# Patient Record
Sex: Male | Born: 1985 | Race: White | Hispanic: No | Marital: Married | Smoking: Never smoker
Health system: Southern US, Community
[De-identification: ages and names within clinical notes are randomized; demographics above are authoritative.]

## PROBLEM LIST (undated history)

## (undated) DIAGNOSIS — K5090 Crohn's disease, unspecified, without complications: Secondary | ICD-10-CM

## (undated) DIAGNOSIS — K219 Gastro-esophageal reflux disease without esophagitis: Secondary | ICD-10-CM

## (undated) DIAGNOSIS — K5000 Crohn's disease of small intestine without complications: Secondary | ICD-10-CM

## (undated) DIAGNOSIS — K589 Irritable bowel syndrome without diarrhea: Secondary | ICD-10-CM

## (undated) HISTORY — DX: Gastro-esophageal reflux disease without esophagitis: K21.9

## (undated) HISTORY — DX: Irritable bowel syndrome, unspecified: K58.9

## (undated) HISTORY — DX: Crohn's disease of small intestine without complications: K50.00

---
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
## 2004-04-25 ENCOUNTER — Ambulatory Visit: Payer: Self-pay | Admitting: Family Medicine

## 2005-01-27 ENCOUNTER — Ambulatory Visit: Payer: Self-pay | Admitting: Family Medicine

## 2005-04-10 ENCOUNTER — Ambulatory Visit: Payer: Self-pay | Admitting: Internal Medicine

## 2005-07-25 ENCOUNTER — Ambulatory Visit: Payer: Self-pay | Admitting: Family Medicine

## 2006-01-12 ENCOUNTER — Ambulatory Visit: Payer: Self-pay | Admitting: Internal Medicine

## 2006-11-06 ENCOUNTER — Ambulatory Visit: Payer: Self-pay | Admitting: Family Medicine

## 2007-03-22 ENCOUNTER — Telehealth: Payer: Self-pay | Admitting: Family Medicine

## 2007-03-27 ENCOUNTER — Ambulatory Visit: Payer: Self-pay | Admitting: Family Medicine

## 2007-03-27 LAB — CONVERTED CEMR LAB
Bilirubin Urine: NEGATIVE
Ketones, urine, test strip: NEGATIVE
Nitrite: NEGATIVE
Specific Gravity, Urine: 1.02
WBC Urine, dipstick: NEGATIVE
pH: 7

## 2007-04-01 ENCOUNTER — Encounter: Payer: Self-pay | Admitting: Family Medicine

## 2007-04-01 LAB — CONVERTED CEMR LAB
ALT: 27 units/L (ref 0–53)
AST: 23 units/L (ref 0–37)
Albumin: 4.6 g/dL (ref 3.5–5.2)
BUN: 13 mg/dL (ref 6–23)
Bilirubin, Direct: 0.2 mg/dL (ref 0.0–0.3)
CO2: 32 meq/L (ref 19–32)
Chloride: 103 meq/L (ref 96–112)
Cholesterol: 157 mg/dL (ref 0–200)
Creatinine, Ser: 0.9 mg/dL (ref 0.4–1.5)
GFR calc non Af Amer: 112 mL/min
HCT: 43.2 % (ref 39.0–52.0)
HDL: 32.6 mg/dL — ABNORMAL LOW (ref >39.0)
Hemoglobin: 15.5 g/dL (ref 13.0–17.0)
LDL Cholesterol: 112 mg/dL — ABNORMAL HIGH (ref 0–99)
MCV: 85.9 fL (ref 78.0–100.0)
Monocytes Relative: 7.9 % (ref 3.0–11.0)
Neutro Abs: 3.9 10*3/uL (ref 1.4–7.7)
Platelets: 247 10*3/uL (ref 150–400)
Potassium: 4.7 meq/L (ref 3.5–5.1)
RDW: 12 % (ref 11.5–14.6)
Sodium: 140 meq/L (ref 135–145)
TSH: 1.4 microintl units/mL (ref 0.35–5.50)
Total Protein: 6.7 g/dL (ref 6.0–8.3)

## 2007-06-14 ENCOUNTER — Ambulatory Visit: Payer: Self-pay | Admitting: Family Medicine

## 2007-06-14 DIAGNOSIS — J209 Acute bronchitis, unspecified: Secondary | ICD-10-CM | POA: Insufficient documentation

## 2007-08-04 DIAGNOSIS — J029 Acute pharyngitis, unspecified: Secondary | ICD-10-CM | POA: Insufficient documentation

## 2007-08-09 ENCOUNTER — Ambulatory Visit: Payer: Self-pay | Admitting: Family Medicine

## 2007-08-09 LAB — CONVERTED CEMR LAB: Rapid Strep: NEGATIVE

## 2007-09-25 DIAGNOSIS — K589 Irritable bowel syndrome without diarrhea: Secondary | ICD-10-CM | POA: Insufficient documentation

## 2007-10-02 ENCOUNTER — Encounter (INDEPENDENT_AMBULATORY_CARE_PROVIDER_SITE_OTHER): Payer: Self-pay | Admitting: *Deleted

## 2008-02-18 ENCOUNTER — Encounter: Payer: Self-pay | Admitting: Internal Medicine

## 2008-06-22 ENCOUNTER — Ambulatory Visit: Payer: Self-pay | Admitting: Family Medicine

## 2008-06-22 DIAGNOSIS — R21 Rash and other nonspecific skin eruption: Secondary | ICD-10-CM | POA: Insufficient documentation

## 2008-07-10 ENCOUNTER — Ambulatory Visit: Payer: Self-pay | Admitting: Family Medicine

## 2008-07-10 DIAGNOSIS — J069 Acute upper respiratory infection, unspecified: Secondary | ICD-10-CM | POA: Insufficient documentation

## 2009-08-10 ENCOUNTER — Telehealth: Payer: Self-pay | Admitting: Family Medicine

## 2009-12-10 DIAGNOSIS — K5000 Crohn's disease of small intestine without complications: Secondary | ICD-10-CM

## 2009-12-10 HISTORY — DX: Crohn's disease of small intestine without complications: K50.00

## 2009-12-10 HISTORY — PX: COLONOSCOPY: SHX174

## 2009-12-15 ENCOUNTER — Ambulatory Visit: Payer: Self-pay | Admitting: Gastroenterology

## 2009-12-15 ENCOUNTER — Encounter (INDEPENDENT_AMBULATORY_CARE_PROVIDER_SITE_OTHER): Payer: Self-pay | Admitting: *Deleted

## 2009-12-16 ENCOUNTER — Encounter: Payer: Self-pay | Admitting: Gastroenterology

## 2009-12-16 LAB — CONVERTED CEMR LAB
BUN: 15 mg/dL (ref 6–23)
Chloride: 105 meq/L (ref 96–112)
Creatinine, Ser: 0.8 mg/dL (ref 0.4–1.5)
Eosinophils Relative: 2.3 % (ref 0.0–5.0)
Glucose, Bld: 112 mg/dL — ABNORMAL HIGH (ref 70–99)
HCT: 45.2 % (ref 39.0–52.0)
Hemoglobin: 15.9 g/dL (ref 13.0–17.0)
IgA: 124 mg/dL (ref 68–378)
Lymphocytes Relative: 28.4 % (ref 12.0–46.0)
Monocytes Relative: 7.3 % (ref 3.0–12.0)
Platelets: 264 10*3/uL (ref 150.0–400.0)
Potassium: 4.1 meq/L (ref 3.5–5.1)
RBC: 5.11 M/uL (ref 4.22–5.81)
RDW: 13.1 % (ref 11.5–14.6)
Sed Rate: 3 mm/hr (ref 0–22)
TSH: 0.87 microintl units/mL (ref 0.35–5.50)

## 2009-12-17 LAB — CONVERTED CEMR LAB: Tissue Transglutaminase Ab, IgA: 2.1 units (ref <20)

## 2010-01-05 ENCOUNTER — Ambulatory Visit: Payer: Self-pay | Admitting: Gastroenterology

## 2010-01-06 ENCOUNTER — Encounter (INDEPENDENT_AMBULATORY_CARE_PROVIDER_SITE_OTHER): Payer: Self-pay | Admitting: *Deleted

## 2010-01-11 DIAGNOSIS — K5090 Crohn's disease, unspecified, without complications: Secondary | ICD-10-CM | POA: Insufficient documentation

## 2010-01-18 ENCOUNTER — Ambulatory Visit (HOSPITAL_COMMUNITY): Admission: RE | Admit: 2010-01-18 | Discharge: 2010-01-18 | Payer: Self-pay | Admitting: Gastroenterology

## 2010-01-26 ENCOUNTER — Telehealth: Payer: Self-pay | Admitting: Gastroenterology

## 2010-02-09 ENCOUNTER — Telehealth: Payer: Self-pay | Admitting: Gastroenterology

## 2010-02-11 ENCOUNTER — Ambulatory Visit: Payer: Self-pay | Admitting: Gastroenterology

## 2010-02-11 DIAGNOSIS — R1031 Right lower quadrant pain: Secondary | ICD-10-CM | POA: Insufficient documentation

## 2010-02-15 ENCOUNTER — Ambulatory Visit: Payer: Self-pay | Admitting: Cardiovascular Disease

## 2010-02-28 ENCOUNTER — Ambulatory Visit: Payer: Self-pay | Admitting: Gastroenterology

## 2010-03-10 ENCOUNTER — Telehealth: Payer: Self-pay | Admitting: Gastroenterology

## 2010-03-29 ENCOUNTER — Telehealth: Payer: Self-pay | Admitting: Gastroenterology

## 2010-04-11 ENCOUNTER — Ambulatory Visit: Payer: Self-pay | Admitting: Gastroenterology

## 2010-04-12 ENCOUNTER — Ambulatory Visit: Payer: Self-pay | Admitting: Gastroenterology

## 2010-04-12 LAB — CONVERTED CEMR LAB
ALT: 28 units/L (ref 0–53)
Albumin: 4.4 g/dL (ref 3.5–5.2)
Basophils Absolute: 0 10*3/uL (ref 0.0–0.1)
Basophils Relative: 0.2 % (ref 0.0–3.0)
CO2: 34 meq/L — ABNORMAL HIGH (ref 19–32)
Creatinine, Ser: 0.8 mg/dL (ref 0.4–1.5)
Glucose, Bld: 64 mg/dL — ABNORMAL LOW (ref 70–99)
HCT: 44.3 % (ref 39.0–52.0)
Hemoglobin: 15.5 g/dL (ref 13.0–17.0)
Lymphs Abs: 2.6 10*3/uL (ref 0.7–4.0)
Monocytes Absolute: 1.1 10*3/uL — ABNORMAL HIGH (ref 0.1–1.0)
Neutro Abs: 10.9 10*3/uL — ABNORMAL HIGH (ref 1.4–7.7)
Neutrophils Relative %: 74.6 % (ref 43.0–77.0)
Total Bilirubin: 1.1 mg/dL (ref 0.3–1.2)
Total Protein: 6.9 g/dL (ref 6.0–8.3)
WBC: 14.6 10*3/uL — ABNORMAL HIGH (ref 4.5–10.5)

## 2010-04-13 ENCOUNTER — Telehealth: Payer: Self-pay | Admitting: Gastroenterology

## 2010-04-13 ENCOUNTER — Telehealth (INDEPENDENT_AMBULATORY_CARE_PROVIDER_SITE_OTHER): Payer: Self-pay | Admitting: *Deleted

## 2010-04-18 ENCOUNTER — Telehealth: Payer: Self-pay | Admitting: Gastroenterology

## 2010-04-26 ENCOUNTER — Ambulatory Visit: Payer: Self-pay | Admitting: Gastroenterology

## 2010-04-29 ENCOUNTER — Telehealth (INDEPENDENT_AMBULATORY_CARE_PROVIDER_SITE_OTHER): Payer: Self-pay | Admitting: *Deleted

## 2010-05-04 ENCOUNTER — Telehealth: Payer: Self-pay | Admitting: Internal Medicine

## 2010-05-25 ENCOUNTER — Telehealth: Payer: Self-pay | Admitting: Gastroenterology

## 2010-05-25 ENCOUNTER — Telehealth: Payer: Self-pay | Admitting: Family Medicine

## 2010-05-25 ENCOUNTER — Emergency Department (HOSPITAL_COMMUNITY)
Admission: EM | Admit: 2010-05-25 | Discharge: 2010-05-26 | Payer: Self-pay | Source: Home / Self Care | Admitting: Emergency Medicine

## 2010-05-26 ENCOUNTER — Telehealth: Payer: Self-pay | Admitting: Gastroenterology

## 2010-05-26 ENCOUNTER — Ambulatory Visit: Payer: Self-pay | Admitting: Internal Medicine

## 2010-05-26 ENCOUNTER — Encounter (INDEPENDENT_AMBULATORY_CARE_PROVIDER_SITE_OTHER): Payer: Self-pay | Admitting: *Deleted

## 2010-05-26 DIAGNOSIS — K5000 Crohn's disease of small intestine without complications: Secondary | ICD-10-CM | POA: Insufficient documentation

## 2010-05-26 DIAGNOSIS — R112 Nausea with vomiting, unspecified: Secondary | ICD-10-CM | POA: Insufficient documentation

## 2010-05-26 DIAGNOSIS — R197 Diarrhea, unspecified: Secondary | ICD-10-CM | POA: Insufficient documentation

## 2010-05-31 ENCOUNTER — Ambulatory Visit: Payer: Self-pay | Admitting: Gastroenterology

## 2010-06-16 ENCOUNTER — Telehealth (INDEPENDENT_AMBULATORY_CARE_PROVIDER_SITE_OTHER): Payer: Self-pay | Admitting: *Deleted

## 2010-06-28 ENCOUNTER — Encounter: Payer: Self-pay | Admitting: Gastroenterology

## 2010-07-10 LAB — CONVERTED CEMR LAB
Basophils Relative: 0.3 % (ref 0.0–3.0)
Eosinophils Absolute: 0.2 10*3/uL (ref 0.0–0.7)
GFR calc non Af Amer: 122.62 mL/min (ref >60)
Glucose, Bld: 75 mg/dL (ref 70–99)
HCT: 45.5 % (ref 39.0–52.0)
Hemoglobin: 15.7 g/dL (ref 13.0–17.0)
Lymphs Abs: 2.4 10*3/uL (ref 0.7–4.0)
Monocytes Absolute: 0.9 10*3/uL (ref 0.1–1.0)
Monocytes Relative: 6.8 % (ref 3.0–12.0)
Neutro Abs: 9.7 10*3/uL — ABNORMAL HIGH (ref 1.4–7.7)
Neutrophils Relative %: 73.6 % (ref 43.0–77.0)
RBC: 5.07 M/uL (ref 4.22–5.81)
RDW: 12.9 % (ref 11.5–14.6)
Sed Rate: 4 mm/hr (ref 0–22)
Sodium: 136 meq/L (ref 135–145)
WBC: 13.1 10*3/uL — ABNORMAL HIGH (ref 4.5–10.5)

## 2010-07-14 NOTE — Assessment & Plan Note (Signed)
Review of gastrointestinal problems: 1. Crohn's disease.  Presented with diarrhea, urgency, right lower quadrant discomfort.  Sedimentation rate normal.  Colonoscopy July 2011 found normal colon, terminal ileum was ulcerated, inflamed. Biopsies showed active, chronic ileitis. Small bowel follow-through July 2011 confirmed inflammation in terminal ileum, otherwise was normal.  August, 2011 CT scan showed no clear bowel disease however there was some right lower quadrant Subcentimeter lymph nodes . Prometheus testing July 2011 was "not consistent with IBD," TPMT phenotype was normal.  August, 2011 Entocort caused worse cramping, nausea. September, 2011 prednisone 40 mg a day greatly helped his symptoms however he could not taper past 25-30 mg a day.    History of Present Illness Visit Type: Follow-up Visit Primary GI MD: Owens Loffler MD Primary Provider: Owens Loffler, MD  Requesting Provider: na Chief Complaint: F/u for Crohn's History of Present Illness:     very pleasant 25 year old man whom I last saw about 5-6 weeks ago. He was on 20 two times a day prednisone and has he tapered  from 30 down to 25 he noticed the diarrhea significantly returned and the RLQ nagging pain worsened.    HE has been edgy and tempermental on the prednisone and wants to get off if possible.  He has noticed that his stools smell much worse.    will have 4-5 loose stools a day, some nocturnal.  Urgency has improved.           Current Medications (verified): 1)  Prednisone 10 Mg  Tabs (Prednisone) .... Take 1.5 Pills, Twice A Day 2)  Protonix 40 Mg  Tbec (Pantoprazole Sodium) .[facility] Kitchen.. 1 Each Day 30 Minutes Before Meal 3)  Azathioprine 50 Mg  Tabs (Azathioprine) .... Take 4 Pills, Once A Day  Allergies (verified): 1)  ! Amoxicillin 2)  ! * Omneceff  Vital Signs:  Patient profile:   25 year old male Height:      72 inches Weight:      192 pounds BMI:     26.13 BSA:     2.10 Pulse rate:   74 /  minute Pulse rhythm:   regular BP sitting:   126 / 80  (left arm) Cuff size:   regular  Vitals Entered By: Hope Pigeon CMA (April 12, 2010 10:56 AM)  Physical Exam  Additional Exam:  Constitutional: generally well appearing Psychiatric: alert and oriented times 3 Abdomen: soft, non-tender, non-distended, normal bowel sounds    Impression & Recommendations:  Problem # 1:  Crohn's ileitis he is fairly steroid responsive however he is unable to taper past 25-30 mg of prednisone a day. We will start him on azathioprine 200 mg a day. He'll get a repeat set of labs today including a CBC, complete metabolic profile and we will repeat this in 2 weeks as well. I discussed some of the potential risks of azathioprine including pancreatitis, lymphoma, immunosuppression related infections. He understands and wishes to proceed. I also directed him to the [facility]'s [facility] [employer] [facility] [place] website.  Other Orders: TLB-CBC Platelet - w/Differential (85025-CBCD) TLB-CMP (Comprehensive Metabolic Pnl) (97989-QJJH)  Patient Instructions: 1)  Start azathiaprine 139m pills, take 2 pills once a day. 2)  You will get lab test(s) done today (cbc, cmet) and then in 2 weeks. 3)  Try tapering off prednisone again in 1 month.  For now, hold your prednisone at current dose. 4)  Return to see Dr. JArdis Hughsin 6-7 weeks. 5)  The medication list was reviewed and reconciled.  All  changed / newly prescribed medications were explained.  A complete medication list was provided to the patient / caregiver. Prescriptions: AZATHIOPRINE 50 MG  TABS (AZATHIOPRINE) take 4 pills, once a day  #120 x 3   Entered and Authorized by:   Milus Banister MD   Signed by:   Milus Banister MD on 04/12/2010   Method used:   Electronically to        [facility]       [address]       [place], [place]  [address]       Ph: [phone]       Fax: [phone]   RxID:    [id number]

## 2010-07-14 NOTE — Assessment & Plan Note (Signed)
Review of gastrointestinal problems: 1. Crohn's disease.  Presented with diarrhea, urgency, right lower quadrant discomfort.  Sedimentation rate normal.  Colonoscopy July 2011 found normal colon, terminal ileum was ulcerated, inflamed. Biopsies showed active, chronic ileitis. Small bowel follow-through July 2011 confirmed inflammation in terminal ileum, otherwise was normal.      History of Present Illness Visit Type: Follow-up Visit Primary GI MD: Owens Loffler MD Primary Provider: Owens Loffler, MD  Requesting Provider: na Chief Complaint: Generalized abd pain and diarrhea  History of Present Illness:     very pleasant 25 year old man who is here with his wife today.  ever since he started the entocort, the pain on RLQ has become much more prominant, nagging, getting worse.  HE had doubling over, sharp pain that lasted all night.  Nausea but no vomiting.  he has had pyrosis, which is really getting bothersome, for past 4-5 nights.  His throat is swollwn with pain, hurting when he swallows.  Was put on biaxin 2-3 weeks ago for a sinus infection.  He is still having loose stools, maybe a bit better...the urgency is gone!           Current Medications (verified): 1)  Entocort Ec 3 Mg  Cp24 (Budesonide) .... Take 3 Capsules Daily 2)  Advil 200 Mg Tabs (Ibuprofen) .... As Needed  Allergies (verified): 1)  ! Amoxicillin 2)  ! * Omneceff  Vital Signs:  Patient profile:   25 year old male Height:      72 inches Weight:      185 pounds BMI:     25.18 BSA:     2.06 Pulse rate:   62 / minute Pulse rhythm:   regular BP sitting:   120 / 72  (left arm) Cuff size:   regular  Vitals Entered By: Hope Pigeon CMA (February 11, 2010 3:13 PM)  Physical Exam  Additional Exam:  Constitutional: generally well appearing Psychiatric: alert and oriented times 3 Abdomen: soft, Mildly tender in the right lower quadrant, non-distended, normal bowel sounds    Impression &  Recommendations:  Problem # 1:  Crohn's ileitis I am quite confident of the diagnosis of Crohn's disease in his terminal ileum. See the results summarized above. He is not really feeling much better on Entocort 3 pills a day. Perhaps his disease is worsening, perhaps the Entocort simply is not enough to control his disease. I think he does have some heartburn for which she will take proton pump inhibitor once daily. I'm going to change her to prednisone from Entocort. 20 mg twice daily. I recommended Benadryl he has sleep disturbances. I'm going to get another set of basic labs including a CBC, complete metabolic profile, some Prometheus testing. Since he is still having fairly significant right lower quadrant discomfort wanting a CT scan to make sure we are not missing an abscesshowever however I doubt that will be the case. He'll return to see me in 2 weeks and sooner if needed.  Other Orders: TLB-CBC Platelet - w/Differential (85025-CBCD) TLB-BMP (Basic Metabolic Panel-BMET) (67893-YBOFBPZ) TLB-Sedimentation Rate (ESR) (85652-ESR) IBD Serology (Prometheus #1007) (02585) TPMT Enzyme (Prometheus #3320) (27782) CT Abdomen/Pelvis with Contrast (CT Abd/Pelvis w/con)  Patient Instructions: 1)  Avoid NSAIDs, use tylenol for routine pains. 2)  You will be scheduled for a CT scan of abdomen and pelvis with IV and oral contrast. 3)  Samples of PPI (nexium, protonix). Take one a day, 20-30 min before breakfast meal. 4)  You will get lab test(s) done  today (cbc, bmet, esr, prometheus first stop, TPMT phenotyping). 5)  Low fiber diet for now. 6)  Stop entocort. 7)  Start prednisone 42m pills, take 2 pills twice a day. 8)  Return to see Dr. JArdis Hughsin 2 weeks. 9)  Try benedryl 275mpills, one pill at bedtime. 10)  The medication list was reviewed and reconciled.  All changed / newly prescribed medications were explained.  A complete medication list was provided to the patient /  caregiver. Prescriptions: PREDNISONE 10 MG  TABS (PREDNISONE) take 2 pills, twice a day  #120 x 1   Entered and Authorized by:   DaMilus BanisterD   Signed by:   PaChristian MateMA (AATenahaon 02/11/2010   Method used:   [facility]ically [facility]        [facility]       [address]  [id number]     Ph: [phone]     Fax: [phone] RxID:   [id number] Appended Document:  Prometheus testing shows "pattern not consistent with inflammatory bowel disease" TMPT phenotype activity was normal

## 2010-07-14 NOTE — Assessment & Plan Note (Signed)
History of Present Illness Visit Type: new patient  Primary GI MD: Owens Loffler MD Primary Provider: Owens Loffler, MD  Requesting Provider: na Chief Complaint: Frequent BMs History of Present Illness:     Russell [name] who was self referred,  since a young boy was told he had IBS. In the past year he has had episodes of uncontrollable diarrhrea for 1-2 weeks, +can have nocturnal symptoms.  Can have small amount of red blood in TP.  Does not usually have assciated nausea/vomiting.   He has wondered if stress is playing a role.  More stress in life.  He lately has been having lower abdominal cramps, that are relieved when he moves his bowels.    Real urgency is biggest issue.    He used to think that greasy foods were the biggest cause.     No real relation with dairy.  Went to [facility] as a young boy, lactose testing was negative from what he remembers.  takes pepto at times.             Current Medications (verified): 1)  Pepto-Bismol 524 Mg/67m Susp (Bismuth Subsalicylate) .... As Needed  Allergies (verified): 1)  ! Amoxicillin 2)  ! * Omneceff 3)  ! Biaxin (Clarithromycin)  Past History:  Past Medical History: Irritable bowel-like symptoms since a young boy  Past Surgical History: Tonsillectomy, 171'sDehydrated X 2 ( GI viruses)   Family History: Parents healthy Siblings: One sister No CAD or DM Some HTN No cancer Crohn's disease: cousin   Social History: Married -839-monthld son Occupation: [employer] Sm[employer] Alcohol use-no  Review of Systems       Pertinent positive and negative review of systems were noted in the above HPI and GI specific review of systems.  All other review of systems was otherwise negative.   Vital Signs:  Patient profile:   [age]ear old male Height:      72 inches Weight:      186 pounds BMI:     25.32 BSA:     2.07 Pulse rate:   64 / minute Pulse rhythm:   regular BP  sitting:   118 / 76  (left arm) Cuff size:   regular  Vitals Entered By: KeHope PigeonMA (December 15, 2009 9:58 AM)  Physical Exam  Additional Exam:  Constitutional: generally well appearing Psychiatric: alert and oriented times 3 Eyes: extraocular movements intact Mouth: oropharynx moist, no lesions Neck: supple, no lymphadenopathy Cardiovascular: heart regular rate and rythm Lungs: CTA bilaterally Abdomen: soft, Russell mildly tender in the right lower quadrantr, non-distended, no obvious ascites, no peritoneal signs, normal bowel sounds Extremities: no lower extremity edema bilaterally Skin: no lesions on visible extremities    Impression & Recommendations:  Problem # 1:  chronic loose stools, intermittent abdominal cramps, right lower quadrant mild tenderness I think he at least has an ear double bowel component to his symptoms and prevent, the cramps that he has, I will put him on twice daily antispasmodic medicine trial. He has had symptoms for a Russell long time, nocturnal diarrhea with extreme urgency at some point. Crohn's runs in his family and certainly Crohn's and colitis could cause symptoms such as his and so I think we should proceed with full colonoscopy with look in the terminal ileum at his soonest convenience. He will also get celiac sprue testing done serologically along with a CBC, complete metabolic profile, sedimentation rate and thyroid testing.  Other  Orders: TLB-CBC Platelet - w/Differential (85025-CBCD) TLB-CMP (Comprehensive Metabolic Pnl) (71062-IRSW) TLB-TSH (Thyroid Stimulating Hormone) (84443-TSH) TLB-Sedimentation Rate (ESR) (85652-ESR) T-Sprue Panel (Celiac Disease Aby Eval) (83516x3/[address]) TLB-IgA (Immunoglobulin A) (82784-IGA)  Patient Instructions: 1)  You will get lab test(s) done today (cbc, cmet, esr, tsh, celiac sprue panel, total IgA level). 2)  You will be scheduled to have a colonoscopy. 3)  Trial of levbid, one pill twice a day. These were  called in to your pharmacy. 4)  A copy of this information will be sent to Dr. Frederico Hamman Copland. 5)  The medication list was reviewed and reconciled.  All changed / newly prescribed medications were explained.  A complete medication list was provided to the patient / caregiver. Prescriptions: LEVBID 0.375 MG  TB12 (HYOSCYAMINE SULFATE) take one pill twice daily  #60 x 2   Entered and Authorized by:   Milus Banister MD   Signed by:   Milus Banister MD on 12/15/2009   Method used:   [facility]        [facility] [place] [facility]retail[facility]       [address]       [place], [place]  [address]       Ph: [phone] or [phone]       Fax: [phone]   RxID:   [id number]   Appended Document: Orders Update/Movi    Clinical Lists Changes  Medications: Added new medication of MOVIPREP 100 GM  SOLR (PEG-KCL-NACL-NASULF-NA ASC-C) As per prep instructions. - Signed Rx of MOVIPREP 100 GM  SOLR (PEG-KCL-NACL-NASULF-NA ASC-C) As per prep instructions.;  #1 x 0;  Signed;  Entered by: Christian Mate CMA (AAMA);  Authorized by: Milus Banister MD;  Method used: [facility], [place], [place]  [address], Ph: [phone] or [phone], Fax: [phone] Orders: Added new Test order of Colonoscopy (Colon) - Signed    Prescriptions: MOVIPREP 100 GM  SOLR (PEG-KCL-NACL-NASULF-NA ASC-C) As per prep instructions.  #1 x 0   Entered by:   Christian Mate CMA ([place])   Authorized by:   Milus Banister MD   Signed by:   Christian Mate CMA ([place]) on 12/15/2009   Method used:   [facility]        [facility] [address] [facility]       [address]       [place], [place]  [address]       Ph: [phone] or [phone]       Fax: [phone]   RxID:   [id number]

## 2010-07-14 NOTE — Progress Notes (Signed)
Summary: [facility] Referal  Phone Note Other Incoming   Caller: Pt spouse Summary of Call: Pt spouse called and she states that the pt would like to be referred to Dr Mamie Laurel at [place].  I will forward the records. Initial call taken by: Christian Mate CMA ([place]),  April 13, 2010 11:14 AM

## 2010-07-14 NOTE — Progress Notes (Signed)
Summary: pt going to ER  Phone Note Call from Patient   Caller: Spouse Call For: Owens Loffler MD Summary of Call: Pt's wife states pt has been vomiting all day, has had diarrhea all day and has not urinated since 7 AM.  Advised pt's wifed that pt should go to ER for possible fluids. Initial call taken by: Marty Heck CMA, AAMA,  May 25, 2010 4:45 PM  Follow-up for Phone Call        That is probably reasonable.  It would have also been reasonable to see him in the AM since he is 25 years old. Owens Loffler MD  May 25, 2010 5:46 PM  Follow-up by: Owens Loffler MD,  May 25, 2010 5:46 PM

## 2010-07-14 NOTE — Miscellaneous (Signed)
Summary: rx  Clinical Lists Changes  Medications: Removed medication of MOVIPREP 100 GM  SOLR (PEG-KCL-NACL-NASULF-NA ASC-C) As per prep instructions. Added new medication of ENTOCORT EC 3 MG  CP24 (BUDESONIDE) Take 3 capsules daily - Signed Rx of ENTOCORT EC 3 MG  CP24 (BUDESONIDE) Take 3 capsules daily;  #90 x 3;  Signed;  Entered by: Milus Banister MD;  Authorized by: Milus Banister MD;  Method used: [facility], [place], [place]  [address], Ph: [phone] or [phone], Fax: [phone]    Prescriptions: ENTOCORT EC 3 MG  CP24 (BUDESONIDE) Take 3 capsules daily  #90 x 3   Entered and Authorized by:   Milus Banister MD   Signed by:   Milus Banister MD on 01/05/2010   Method used:   [facility]        [facility] [facility]retail)       [address]       [place], [place]  [address]       Ph: [phone] or [phone]       Fax: [phone]   RxID:   [id number]

## 2010-07-14 NOTE — Procedures (Signed)
Summary: Colonoscopy  Patient: Russell [name] Note: All result statuses are Final unless otherwise noted.  Tests: (1) Colonoscopy (COL)   COL Colonoscopy           [facility]     [place], [place]  [address]           COLONOSCOPY PROCEDURE REPORT           PATIENT:  Russell [name], Russell [name]  MR#:  [id number]     BIRTHDATE:  1986/05/18, 23 yrs. old  GENDER:  male     ENDOSCOPIST:  Milus Banister, MD     REF. BY:  Frederico Hamman T. Copland, M.D.     PROCEDURE DATE:  01/05/2010     PROCEDURE:  Colonoscopy with biopsy     ASA CLASS:  Class I     INDICATIONS:  diarrhea, cousin with crohn's     MEDICATIONS:   Fentanyl 100 mcg IV, Versed 10 mg IV           DESCRIPTION OF PROCEDURE:   After the risks benefits and     alternatives of the procedure were thoroughly explained, informed     consent was obtained.  Digital rectal exam was performed and     revealed no rectal masses.   The [id number] endoscope was     introduced through the anus and advanced to the terminal ileum     which was intubated for a short distance, without limitations.     The quality of the prep was good, using MoviPrep.  The instrument     was then slowly withdrawn as the colon was fully examined.     <<PROCEDUREIMAGES>>           FINDINGS:  There were inflammatory changes in the ileum: several     small ulcers and edematous mucosa in terminal ileum. More proximal     ileum was normal. Biopsies were taken to check for Crohn's (see     image3).  This was otherwise a normal examination of the colon     (see image2 and image5).   Retroflexed views in the rectum     revealed no abnormalities.    The scope was then withdrawn from     the patient and the procedure completed.           COMPLICATIONS:  None           ENDOSCOPIC IMPRESSION:     1) Ileitis, biopsied to check for Crohn's.     2) Otherwise normal examination           RECOMMENDATIONS:     Will start on entocort (3 pills a  day).     Await pathology for final recommendations.     My office will schedule return visit in 4-5 weeks.           ______________________________     Milus Banister, MD           n.     eSIGNED:   Milus Banister at 01/05/2010 03:40 PM           Russell [name], Russell [name]  Note: An exclamation mark (!) indicates a result that was not dispersed into the flowsheet. Document Creation Date: 01/05/2010 3:40 PM _______________________________________________________________________  (1) Order result status: Final Collection or observation date-time: 01/05/2010 15:30 Requested date-time:  Receipt date-time:  Reported date-time:  Referring Physician:   Ordering Physician:  Owens Loffler [phone]) Specimen Source:  Source: Tawanna Cooler Order Number: [id number] Lab site:   Appended Document: Colonoscopy ROV made letter mailed

## 2010-07-14 NOTE — Assessment & Plan Note (Signed)
Review of gastrointestinal problems: 1. Crohn's disease.  Presented with diarrhea, urgency, right lower quadrant discomfort.  Sedimentation rate normal.  Colonoscopy July 2011 found normal colon, terminal ileum was ulcerated, inflamed. Biopsies showed active, chronic ileitis. Small bowel follow-through July 2011 confirmed inflammation in terminal ileum, otherwise was normal.  August, 2011 CT scan showed no clear bowel disease however there was some right lower quadrant Subcentimeter lymph nodes . Prometheus testing July 2011 was "not consistent with IBD," TPMT phenotype was normal.    History of Present Illness Visit Type: Follow-up Visit Primary GI MD: Owens Loffler MD Primary Provider: Owens Loffler, MD  Requesting Provider: na Chief Complaint: Follow up History of Present Illness:     very pleasant 25 year old man whom I last saw 2 weeks ago.  Pred 40 a day since then.  he feels better in "every way" the diarrhea and pain are both completley GONE.    Has two, non-bloody, regular BMs a day.    He is sleeping poorly, getting up at night.  Has otherwise felt great!  Mild post prandial symptoms of bloating. His upper GI, dyspeptic symptoms are gone since he started proton pump inhibitor.             Current Medications (verified): 1)  Nexium 40 Mg Cpdr (Esomeprazole Magnesium) .[facility].. 1 By Mouth Once Daily 2)  Prednisone 10 Mg  Tabs (Prednisone) .... Take 2 Pills, Twice A Day  Allergies (verified): 1)  ! Amoxicillin 2)  ! * Omneceff  Vital Signs:  Patient profile:   26 year old male Height:      72 inches Weight:      189 pounds BMI:     25.73 BSA:     2.08 Pulse rate:   80 / minute Pulse rhythm:   regular BP sitting:   120 / 78  (left arm)  Vitals Entered By: Genella Mech CMA Deborra Medina) (February 28, 2010 8:37 AM)  Physical Exam  Additional Exam:  Constitutional: generally well appearing Psychiatric: alert and oriented times 3 Abdomen: soft, mildly tender in the  right lower quadrant,  non-distended, normal bowel sounds    Impression & Recommendations:  Problem # 1:  Crohn's ileitis D. Prometheus testing was "not consistent with IBD" however looking in his terminal ileum showed chronic ulcers consistent with IBD, small bowel follow-through is also consistent. He is responding to steroids and await is consistent with Crohn's disease and so I do still suspect he indeed has Crohn's disease. He is on 40 mg a day of prednisone he will start backing off in about 2 weeks by 5 mg a day. He'll return to see me in one months time and we will likely restart Entocort so it like to have him on that instead of prednisone chronically. If he is unable to make that transition then we will consider immunomodulators. I did offer him a second opinion at [facility] given diagnostic uncertainty since the Prometheus testing came back negative for IBD. I told him I am still comfortable and he was not interested in second opinion.  Patient Instructions: 1)  Continue on prednisone 4 pills a day for another 2 weeks, then cut back to 68m a day for 1 week, then 394ma day for one week, then 2520m day for a week, etc... 2)  Return to see Dr. JacArdis Hughs 75mo37monthll likly retry entocort at time.   3)  The medication list was reviewed and reconciled.  All changed / newly prescribed medications were  explained.  A complete medication list was provided to the patient / caregiver. Prescriptions: NEXIUM 40 MG CPDR (ESOMEPRAZOLE MAGNESIUM) 1 by mouth once daily  #30 x 11   Entered and Authorized by:   Milus Banister MD   Signed by:   Milus Banister MD on 02/28/2010   Method used:   [facility]ron[facility]        [facility]       [place], [place]  [address]       Ph: [phone]       Fax: [phone]   RxID:   [id number]   Appended Document:  please let him know that I changed his PPI to protonix (preferred by  his insurance), should work just as well as nexium, new script called in.   Clinical Lists Changes  Medications: Removed medication of NEXIUM 40 MG CPDR (ESOMEPRAZOLE MAGNESIUM) 1 by mouth once daily Added new medication of PROTONIX 40 MG  TBEC (PANTOPRAZOLE SODIUM) 1 each day 30 minutes before meal - Signed Rx of PROTONIX 40 MG  TBEC (PANTOPRAZOLE SODIUM) 1 each day 30 minutes before meal;  #30 x 6;  Signed;  Entered by: Milus Banister MD;  Authorized by: Milus Banister MD;  Method used: [facility] [address] [place], [place], [place]  [address], Ph: [phone], Fax: [phone]    Prescriptions: PROTONIX 40 MG  TBEC (PANTOPRAZOLE SODIUM) 1 each day 30 minutes before meal  #30 x 6   Entered and Authorized by:   Milus Banister MD   Signed by:   Milus Banister MD on 03/02/2010   Method used:   Electronically to        [facility]       [place], [place]  [address]       Ph: [phone]       Fax: [phone]   RxID:   [id number]    Appended Document:  pt's wife notified new rx at pharmacy.  Advised to call if protonix does not work.

## 2010-07-14 NOTE — Progress Notes (Signed)
Summary: Chest pain  Phone Note Call from Patient Call back at Home Phone [phone]   Caller: Spouse Call For: Owens Loffler MD Summary of Call: Patient's spouse called and said that her husband is having chest pain.  Been going on for about a week now but today he is complaining of chest tightness and it feels like something is sitting on his chest.  He has no SOB, no wheezing, no headaches, no other symptoms.  Advised her to get him to the ER as soon as possible.   Initial call taken by: Sherrian Divers CMA [facility]),  August 10, 2009 11:09 AM

## 2010-07-14 NOTE — Progress Notes (Signed)
  Spoke to the pt's wife and he has been doing fine and he hasn't had any symptoms since he saw Amy.  Dr Ardis Hughs talked to him about seeing someone at [facility] and  he does have an appt at [facility] sometime in the next week.   ---- Converted from flag ---- ---- 06/16/2010 2:36 PM, Amy S Esterwood PA-c wrote: PLEASE CALL HIM AND BE SURE HIS SXS HAVE COMPLETEY CLEARED-IF SO HE DOESNT NEED TO DO THE CDIFF-  I THINK HE HAD A VIRAL INFECTION  THE DAY I SAW HIM.  ---- 06/14/2010 11:38 AM, Marisue Humble NCMA wrote: You saw this pt on 05-26-2010. You ordered CDiff by PCR and I called [facility] and they do not have record  of labs results.  Our lab doesn't have record he returned the stool studies. Do you want me to call him to bring stool sample to our lab?  He hasn't call us for any triage since he saw you.  I was working on my log and this is on it yet. ------------------------------

## 2010-07-14 NOTE — Progress Notes (Signed)
Summary: Has a cold  Phone Note Call from Patient Call back at Home Phone [phone]   Call For: DR JACOBS Summary of Call: Taking Entocort and has a cold. Is there a restriction on what kind of cold medicine he can use? ok to speak to spouse if she answers phone. Initial call taken by: Irwin Brakeman [facility],  January 26, 2010 8:24 AM  Follow-up for Phone Call        pt advised to call his pharmacy to ask about medication interactions Follow-up by: Christian Mate CMA [facility]),  January 26, 2010 8:50 AM

## 2010-07-14 NOTE — Letter (Signed)
Summary: Appt Reminder [facility]  [place], [place] [address]   Phone: [phone]  Fax: [phone]        January 06, 2010 MRN: [id number]    Russell [name] [place],   [id number]    Dear [name],   You have a return appointment with Dr. Ardis Hughs on 02/16/10 at 2:15 pm.  Please remember to bring a complete list of the medicines you are taking, your insurance card and your co-pay.  If you have to cancel or reschedule this appointment, please call before 5:00 pm the evening before to avoid a cancellation fee.  If you have any questions or concerns, please call [phone].    Sincerely,    Christian Mate CMA ([place])  Appended Document: Appt Reminder 2 letter mailed

## 2010-07-14 NOTE — Progress Notes (Signed)
Summary: triage  Phone Note Other Incoming Call back at [phone]   Caller: Spouse, Danae Chen Details for Reason: triage Summary of Call: Pt's wife said he is having complications with his Chron's and has been very sick.  Would like to talk to you about it. Initial call taken by: Darliss Ridgel,  May 25, 2010 8:26 AM  Follow-up for Phone Call        Pt has been off prednisone 1 1/2 weeks and has now became very sick, vomiting, abd pain and diarrhea.  He will see [facility] on 06/28/10.  He has some Entocort and wants to know if he should take this,  he did develop a rash on his face while on prednisone.  Please advise.  Additional Follow-up for Phone Call Additional follow up Details #1::        yes, restart entocort, 3 pills a day.  he should call in 3-4 days if not improving. Additional Follow-up by: Milus Banister MD,  May 25, 2010 8:46 AM    Additional Follow-up for Phone Call Additional follow up Details #2::    pt aware he will call if needed Follow-up by: Christian Mate CMA [facility]),  May 25, 2010 8:48 AM

## 2010-07-14 NOTE — Progress Notes (Signed)
Summary: Triage  Phone Note Call from Patient   Caller: Russell [name] [phone] Call For: Dr. Ardis Hughs Reason for Call: Talk to Nurse Summary of Call: "Doubled over with abd pain last night", acid reflux....has appt. on 9-7-11and requesting sooner appt. Initial call taken by: Webb Laws,  February 09, 2010 9:23 AM  Follow-up for Phone Call        pt has had severe abdominal pain in the RLQ and diarrhea since starting the Entocort.  Has worsened over the last month.  The urgency is better but the pain continues. No fever or rectal bleeding. Also complains of trouble swallowing, the pt feels as if this is reflux.  He has appt with Dr Ardis Hughs on 02/16/10.  Is there anything he can do until  the appt for the pain and reflux? Follow-up by: Christian Mate CMA [facility]),  February 09, 2010 10:05 AM  Additional Follow-up for Phone Call Additional follow up Details #1::        rov with me friday (3:15 apt is open)  take once daily OTC prilosec 20-30 min prior to bf meal daily.  Try levsin 0.125 sublingual tabs (disp 50, taken 1-2 every 3-4 hours as needed for pains, 3 refills) Additional Follow-up by: Milus Banister MD,  February 09, 2010 10:12 AM    Additional Follow-up for Phone Call Additional follow up Details #2::    Dr Ardis Hughs you really dont have a 3:15 appt in EMR it looks like it but not in [place] it will double book you is that ok? Christian Mate CMA [facility])  February 09, 2010 10:19 AM   Additional Follow-up for Phone Call Additional follow up Details #3:: Details for Additional Follow-up Action Taken: there are only 9 patients scheduled that afternoon,  I usually have spots for 10 so I must have an opening somewhere that afternoon. Additional Follow-up by: Milus Banister MD,  February 09, 2010 10:39 AM  New/Updated Medications: LEVSIN 0.125 MG TABS (HYOSCYAMINE SULFATE) 1-2 q 3-4 hours as needed pain Prescriptions: LEVSIN 0.125 MG TABS (HYOSCYAMINE SULFATE) 1-2 q 3-4 hours as needed pain  #50 x 3  Entered by:   Christian Mate CMA ([place])   Authorized by:   Milus Banister MD   Signed by:   Christian Mate CMA ([place]) on 02/09/2010   Method used:   Electronically to        [facility] (retail)       [place], [place]  [address]       Ph: [phone]       Fax: [phone]   RxID:   [id number]  pt aware and meds sent to pharmacy

## 2010-07-14 NOTE — Letter (Signed)
Summary: [facility] Instructions  [facility]  [place], [place] [address]   Phone: [phone]  Fax: [phone]       INDIGO CHADDOCK    29-Oct-1985    MRN: [id number]        Procedure Day /Date:01/05/10  WED     Arrival Time:2 pm     Procedure Time:3 pm     Location of Procedure:                    X  [place] (4th Floor)                        [place]   Starting 5 days prior to your procedure 12/31/09 do not eat nuts, seeds, popcorn, corn, beans, peas,  salads, or any raw vegetables.  Do not take any fiber supplements (e.g. Metamucil, Citrucel, and Benefiber).  THE DAY BEFORE YOUR PROCEDURE         DATE:01/04/10  DAY: TUE  1.  Drink clear liquids the entire day-NO SOLID FOOD  2.  Do not drink anything colored red or purple.  Avoid juices with pulp.  No orange juice.  3.  Drink at least 64 oz. (8 glasses) of fluid/clear liquids during the day to prevent dehydration and help the prep work efficiently.  CLEAR LIQUIDS INCLUDE: Water Jello Ice Popsicles Tea (sugar ok, no milk/cream) Powdered fruit flavored drinks Coffee (sugar ok, no milk/cream) Gatorade Juice: apple, white grape, white cranberry  Lemonade Clear bullion, consomm, broth Carbonated beverages (any kind) Strained chicken noodle soup Hard Candy                             4.  In the morning, mix first dose of MoviPrep solution:    Empty 1 Pouch A and 1 Pouch B into the disposable container    Add lukewarm drinking water to the top line of the container. Mix to dissolve    Refrigerate (mixed solution should be used within 24 hrs)  5.  Begin drinking the prep at 5:00 p.m. The MoviPrep container is divided by 4 marks.   Every 15 minutes drink the solution down to the next mark (approximately 8 oz) until the full liter is complete.   6.  Follow completed prep with 16 oz of clear liquid of your choice (Nothing red or purple).   Continue to drink clear liquids until bedtime.  7.  Before going to bed, mix second dose of MoviPrep solution:    Empty 1 Pouch A and 1 Pouch B into the disposable container    Add lukewarm drinking water to the top line of the container. Mix to dissolve    Refrigerate  THE DAY OF YOUR PROCEDURE      DATE: 01/05/10  DAY: WED  Beginning at 10 a.m. (5 hours before procedure):         1. Every 15 minutes, drink the solution down to the next mark (approx 8 oz) until the full liter is complete.  2. Follow completed prep with 16 oz. of clear liquid of your choice.    3. You may drink clear liquids until 1 pm (2 HOURS BEFORE PROCEDURE).   MEDICATION INSTRUCTIONS  Unless otherwise instructed, you should take regular prescription medications with a small sip of water   as early as possible the morning of your procedure.  OTHER INSTRUCTIONS  You will need a responsible adult at least 27 years of age to accompany you and drive you home.   This person must remain in the waiting room during your procedure.  Wear loose fitting clothing that is easily removed.  Leave jewelry and other valuables at home.  However, you may wish to bring a book to read or  an iPod/MP3 player to listen to music as you wait for your procedure to start.  Remove all body piercing jewelry and leave at home.  Total time from sign-in until discharge is approximately 2-3 hours.  You should go home directly after your procedure and rest.  You can resume normal activities the  day after your procedure.  The day of your procedure you should not:   Drive   Make legal decisions   Operate machinery   Drink alcohol   Return to work  You will receive specific instructions about eating, activities and medications before you leave.    The above instructions have been reviewed and explained to me by   _______________________    I fully understand and can verbalize these instructions  _____________________________ Date _________

## 2010-07-14 NOTE — Progress Notes (Signed)
Summary: med concern  Phone Note Call from Patient Call back at [phone]   Caller: Patient Call For: Dr. Ardis Hughs Reason for Call: Talk to Nurse Summary of Call: after doing some research, pt doesnt feel comfortable taking Azathioprine... would like to know what his other options are Initial call taken by: Lucien Mons,  April 13, 2010 9:22 AM  Follow-up for Phone Call        Dr Ardis Hughs please advise Christian Mate CMA [facility])  April 13, 2010 9:45 AM   Additional Follow-up for Phone Call Additional follow up Details #1::        other options are remicade, humira type meds.  Ask him to look into those on [facility] website.  If still not comfortable, can arrange for second opinion at [facility] with crohn's specialist Dr. Maryelizabeth Kaufmann. Additional Follow-up by: Milus Banister MD,  April 13, 2010 9:47 AM    Additional Follow-up for Phone Call Additional follow up Details #2::    pt has all the info and will research and let us know what he chooses to do Follow-up by: Christian Mate CMA [facility]),  April 13, 2010 9:50 AM

## 2010-07-14 NOTE — Consult Note (Signed)
Summary: ER consult  NAME:  Russell [name], Russell [name] NO.:  [id number]      MEDICAL RECORD NO.:  [id number]          PATIENT TYPE:  EMS      LOCATION:  ED                           FACILITY[facility]  [facility]      PHYSICIAN:  Rise Patience, MDDATE OF BIRTH:  11-10-1985      DATE OF CONSULTATION:  05/26/2010   DATE OF DISCHARGE:                                    CONSULTATION         PRIMARY GASTROENTEROLOGIST:  Milus Banister, MD, [place].      CHIEF COMPLAINT:  Abdominal pain, nausea, vomiting, and diarrhea.      HISTORY OF PRESENT ILLNESS:  This is a 25 year old male with known   history of Crohn disease, on prednisone, which was just tapered off last   week and was supposed to start Entocort.  He has been having abdominal   pain with nausea, vomiting, and diarrhea multiple times today, and had   come to the ER.  In the ER, the patient was found to be febrile with a   temperature of 101 and WBC count around 14,000.  A CT of the abdomen and   pelvis is completely benign at this time showing nothing acute.  At this   time, the patient's symptoms are completely resolved and the patient is   eager to go home.  I have advised observation, but the patient says he   will later go to Dr. Ardis Hughs' office and follow up with him as he feels   fine at this time.      The patient denies any chest pain or shortness of breath.  Denies any   dizziness or loss of consciousness.  Denies headache or visual symptoms.   Denies any cough or phlegm.  Denies any dysuria.      PAST MEDICAL HISTORY:  Crohn disease.      PAST SURGICAL HISTORY:  None.      MEDICATIONS PRIOR TO ADMISSION:  The patient is supposed to start   Entocort and the patient is on doxycycline for his acne.      ALLERGIES:  AMOXICILLIN.      FAMILY HISTORY:  One of his cousins has Crohn disease.      SOCIAL HISTORY:  The patient denies smoking cigarettes, drinking   alcohol, or using illegal drugs.  He is married.        REVIEW OF SYSTEMS:  As per history of present illness.  Nothing else   significant.      PHYSICAL EXAMINATION:  GENERAL:  The patient is examined at bedside, in   no acute distress.   VITAL SIGNS:  Blood pressure is 128/80, pulse is around 90, and it was   as high as 120, respirations 20 per minute, O2 saturation is 99%.   HEENT:  Anicteric.  No pallor.  No discharge from ears, eyes, nose, or   mouth.   CHEST:  Bilateral air entry present.  No rhonchi.  No crepitation.   ABDOMEN:  Soft.  There is tenderness  in the right lower quadrant.  No   guarding or rigidity.  Bowel sounds present.   NEUROLOGICAL:  Alert, awake, and oriented to time, place and person.   EXTREMITIES:  Moves upper and lower extremities, 5/5.  Peripheral pulses   felt.  No edema.      LABORATORY DATA:  CT of the abdomen and pelvis shows nothing acute.   CBC, WBC is 14.6, hemoglobin 15.8, hematocrit 46.1, platelets 225, and   neutrophils 90%.  Complete metabolic panel, sodium 014, potassium 3.9,   chloride 103, carbon dioxide 23, glucose 129, BUN 15, creatinine 1.2,   total bilirubin 1.4, AST 29, ALT 28, total protein 7.7, albumin is 4.8,   calcium 9.8, and lipase 23.  UA is negative for nitrites, leukocyte;   ketones more than 80,  and glucose negative.      ASSESSMENT:   1. Abdominal pain with nausea, vomiting, and diarrhea with a history       of Crohn disease.   2. History of acne.      PLAN:  At this time, the patient is very eager to go home.  I have   advised about observation and calling GI in a.m.  The patient said that   he would go home as he is feeling comfortable and he is going to go to   Dr. Ardis Hughs' office later today.  I have advised he should keep the   appointment with Dr. Ardis Hughs and if in any case his symptoms worsen, he   has to come back to the ER.  The patient has good family support and   they have told that they would come back to the ER if the symptoms   worsen.  At this time, the  patient is very eager to go home, so the   patient is discharged home to follow immediately with Dr. Ardis Hughs, of   [facility], today later in the day.               Rise Patience, MD               ANK/MEDQ  D:  05/26/2010  T:  05/26/2010  Job:  [id number]      cc:   Milus Banister, MD   [address]   [place], [place] [address]

## 2010-07-14 NOTE — Progress Notes (Signed)
Summary: rash around lips  Phone Note Call from Patient   Caller: Spouse Summary of Call: Painful sores around lips for 36-48 hours. Mouth, pharynx ok. I advised that he be seen at [facility] over next 24 hours for dx and Tx ? if this is HSV1 issue, doubt drug reaction  Initial call taken by: Gatha Mayer MD, Marval Regal,  May 04, 2010 6:05 PM

## 2010-07-14 NOTE — Assessment & Plan Note (Signed)
Summary: ER f/u crohns flare/pl                  jacobs pt   History of Present Illness Visit Type: Follow-up Visit Primary GI MD: Owens Loffler MD Primary Provider: Owens Loffler, MD  Requesting Provider: na Chief Complaint: Crohns Flare, Pt went to ER yesterday, pt has diarrhea all day today History of Present Illness:   VERY NICE 25 YO MALE KNOWN RECENTLY TO DR. Ardis Hughs WITH RELATIVELY NEW DX OF CROHNS ILEITIS. HE WAS INITIALLY TREATED WITH PREDISONE WHICH WAS DIFFICULT TO TAPER,HOWEVER PT CAME OFF ABOUT TWO WEEKS AGO AND WAS FEELING WELL. HE HAD NO SXS EXCEPT LOOSE STOOL.  HE WAS TO INITIATE AZATHIOPRINE BUT HAS DECIDED   AGAINST THIS BECAUSE OF SIDE EFFECT PROFILE. HEIS CURRENTLY WAITNG FOR A SECOND OPINION AT [facility] WITH DR. BLOMFIELD IN EARLY JANUARY REGARDING MANAGEMENT/BIOLOGIC THERAPY ETC. PT HAD ACUTE ONSET YESTERDAY AM WITH NAUSEA, THEN MULTIPLE EPISODES OF VOMITING, AND DIARRHEA WHICH WAS WATERY AND NONBLOODY. HE HAD A FEVER TO 101, C/O CRAMPS BUT NO SEVERE ABDOMINAL PAIN. HE WAS SEEN IN THE ER, LABS UNREMARKABLE EXCEPT WBC 14,000.  CT ABD/PELVIS WITH CONTRAST WAS COMPLETELY NORMAL.  HE FEELS MUCH BETTER TODAY, NO FURTHER FEVER, NO NAUSEA, EATING LIGHT. HE IS STILL HAVING A LITTLE DIARRHEA X 2 TODAY. HE DENIES ABDOMINAL PAIN. HE THINKS HE HAD" A BUG". HE HAS TAKEN DOXYCYCLINE RECENTLY-JUST FINISHED HIS SECOND COURSE FOR A SKIN INFECTION. NO SICK CONTACTS.    GI Review of Systems    Reports nausea and  vomiting.     Location of  Abdominal pain: lower abdomen.    Denies abdominal pain, acid reflux, belching, bloating, chest pain, dysphagia with liquids, dysphagia with solids, heartburn, loss of appetite, vomiting blood, weight loss, and  weight gain.      Reports diarrhea.     Denies anal fissure, black tarry stools, change in bowel habit, constipation, diverticulosis, fecal incontinence, heme positive stool, hemorrhoids, irritable bowel syndrome, jaundice, light color stool,  liver problems, rectal bleeding, and  rectal pain.    Current Medications (verified): 1)  Protonix 40 Mg  Tbec (Pantoprazole Sodium) .[facility].. 1 Each Day 30 Minutes Before Meal 2)  Azathioprine 50 Mg  Tabs (Azathioprine) .... Take 4 Pills, Once A Day 3)  Promethazine Hcl 25 Mg Tabs (Promethazine Hcl) .... Take 1/2 To 1 Tab Every 6 H Ours As Needed For Nausea May Cause Drowsiness  Allergies (verified): 1)  ! Amoxicillin 2)  ! 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 JACOBS   Review of Systems       The patient complains of fever.  The patient denies allergy/sinus, anemia, anxiety-new, arthritis/joint pain, back pain, blood in urine, breast changes/lumps, change in vision, confusion, cough, coughing up blood, depression-new, fainting, fatigue, headaches-new, hearing problems, heart murmur, heart rhythm changes, itching, menstrual pain, muscle pains/cramps, night sweats, nosebleeds, pregnancy symptoms, shortness of breath, skin rash, sleeping problems, sore throat, swelling of feet/legs, swollen lymph glands, thirst - excessive , urination - excessive , urination changes/pain, urine leakage, vision changes, and voice change.         SEE HPI  Vital Signs:  Patient profile:   25 year old male Height:      72 inches Weight:      190 pounds BMI:     25.86 BSA:     2.09 Pulse rate:   74 / minute Pulse rhythm:   regular BP sitting:   100 / 70  (left arm)  Vitals Entered By: Red[place] Deborra Medina) (May 26, 2010 1:38 PM)  Physical Exam  General:  Well developed, well nourished, no acute  distress. Head:  Normocephalic and atraumatic. Eyes:  PERRLA, no icterus. Lungs:  Clear throughout to auscultation. Heart:  Regular rate and rhythm; no murmurs, rubs,  or bruits. Abdomen:  SOFT, NONTENDER, NO MASS OR HSM,BS+ Rectal:  NOT DONE Neurologic:  Alert and  oriented x4;  grossly normal neurologically. Psych:  Alert and cooperative. Normal mood and affect.   Impression & Recommendations:  Problem # 1:  NAUSEA AND VOMITING (ICD-787.01) Assessment New 24 YO MALE WITH RECENTLY DX CROHNS ILEITIS . NO CURRENT MEDICAL THERAPY-WEANED OFF STEROIDS 2 WEEKS AGO. NOW WITH ACUTE ILLNESS WITH NAUSEA/VOMITING, AND DIARRHEA X 24 HOURS,NEGATIVE CT.  MOST CONSISTENT WITH AN INFECTIOUS GASTROENTERITIS, DOUBT CROHNS EXACERBATION. R/O C.DIFF GIVEN IBD AND ANTIBIOTICS.   OBSERVATION OFF STEROIDS WHICH HE DID NOT TOLERATE WELL STOOL FOR C.DIFF  PCR GRADUALLY ADVANCE DIET PHENERGAN 25 MG Q 6 HOURS FOR as needed USE .PTWILL CALL  IN 24 HOURS WITH PROGRESS REPORT KEEP APPT AT [facility].  Problem # 2:  CROHN'S DISEASE-SMALL INTESTINE (ICD-555.0) Assessment: Comment Only  Other Orders: T-C diff by PCR ([id number])  Patient Instructions: 1)  Please go to lab, basement level. 2)  We sent the  prescription for Phenergan to [facility], [address] [place].  3)  Copy sent to : Donella Stade, MD 4)  The medication list was reviewed and reconciled.  All changed / newly prescribed medications were explained.  A complete medication list was provided to the patient / caregiver. Prescriptions: PROMETHAZINE HCL 25 MG TABS (PROMETHAZINE HCL) Take 1/2 to 1 tab every 6 h ours as needed for nausea May cause drowsiness  #45 x 0   Entered by:   Marisue Humble NCMA   Authorized by:   Alfredia Ferguson PA-c   Signed by:   Marisue Humble NCMA on 05/26/2010   Method used:   Electronically to        [facility] ([facility]       [address]       [place], [place]  [address]        Ph: [phone]       Fax: [phone]   RxID:   [id number]

## 2010-07-14 NOTE — Progress Notes (Signed)
Summary: Prednizone  Phone Note Call from Patient Call back at Home Phone [phone]   Call For: Dr Ardis Hughs Summary of Call: Come Monday is supposed to start scaling his medicine down come monday- but he had a really bad week. Initial call taken by: Irwin Brakeman [facility],  March 10, 2010 4:25 PM  Follow-up for Phone Call        Pt was doing really, this week the pain increased a bit.  Should he decrease his prednisone on Monday as directed?  Please advise Follow-up by: Christian Mate CMA Deborra Medina),  March 10, 2010 4:33 PM  Additional Follow-up for Phone Call Additional follow up Details #1::        yes, I still want him to try to cut back. slowly, as we talked about in office.   Additional Follow-up by: Milus Banister MD,  March 10, 2010 5:35 PM    Additional Follow-up for Phone Call Additional follow up Details #2::    pt aware Follow-up by: Christian Mate CMA Deborra Medina),  March 11, 2010 8:04 AM

## 2010-07-14 NOTE — Progress Notes (Signed)
Summary: Lab reminder  Phone Note Outgoing Call   Call placed by: Christian Mate CMA Deborra Medina),  April 29, 2010 9:56 AM Summary of Call: called and reminded pt to have labs, he will be in on Mon Initial call taken by: Christian Mate CMA Deborra Medina),  April 29, 2010 9:56 AM

## 2010-07-14 NOTE — Progress Notes (Signed)
Summary: Question about Prednizone  Phone Note Call from Patient   Caller: Russell [name] [phone] Call For: Dr Ardis Hughs Summary of Call: Was referred to [facility] but his appoinment is not until January. is supposed to be off Prednizone. Initial call taken by: [facility] Brak[facility],  April 18, 2010 8:53 AM  Follow-up for Phone Call        no answer on number given, left message on machine to call back on home phone . Christian Mate CMA Deborra Medina)  April 18, 2010 9:48 AM   Pt wife called and said the pt really wants to try and taper off the prednisone now, instead of waiting a month  because of the side effects.  Is this ok to do? Follow-up by: Christian Mate CMA Deborra Medina),  April 18, 2010 11:48 AM  Additional Follow-up for Phone Call Additional follow up Details #1::        that is ok, let me know how he does Additional Follow-up by: Milus Banister MD,  April 18, 2010 1:38 PM    Additional Follow-up for Phone Call Additional follow up Details #2::    pt aware he will call with any problems or concerns Follow-up by: Christian Mate CMA Deborra Medina),  April 18, 2010 1:53 PM

## 2010-07-14 NOTE — Progress Notes (Signed)
Summary: Prednizone weaning  Phone Note Call from Patient Call back at [phone]   Call For: DR Ardis Hughs Summary of Call: The more he weans off Prednizone the sicker he gets Initial call taken by: [facility] Brakeman [facility],  March 29, 2010 8:58 AM  Follow-up for Phone Call        pt is getting more pain after weening of the prednisone.  He is currently on 10 mg 11/2 in the morning and 1 at night.  He is to decrease further on Monday the 24th.  I advised the pt to continue his current dose and I will speak to Dr Ardis Hughs when he returns on Monday about decreasing further or for any additional orders.  Pt will call if he worsens before Monday.  Please advise Dr Ardis Hughs Follow-up by: Christian Mate CMA Deborra Medina),  March 29, 2010 10:34 AM  Additional Follow-up for Phone Call Additional follow up Details #1::        he should not decrease any further and actually should increase back to 15 in AM and 15 in PM.  already has rov set for nov 1st.  if he can't wait, double book or PA visit (cbc, cmet, esr just prior). Additional Follow-up by: Milus Banister MD,  March 30, 2010 7:20 AM    Additional Follow-up for Phone Call Additional follow up Details #2::    pt aware labs in [place] He will call if he is unable to wait for 11/1 appt Follow-up by: Christian Mate CMA Deborra Medina),  March 30, 2010 8:43 AM

## 2010-07-14 NOTE — Progress Notes (Signed)
Summary: Triage  Phone Note Call from Patient Call back at [phone]   Caller: Patients wife Call For: Dr. Ardis Hughs Reason for Call: Talk to Nurse Summary of Call: Pt went to ER last night for vomiting, they did CT and think it might be a crohns flare up, was told to folow up with Dr. Ardis Hughs Initial call taken by: Martinique Johnson,  May 26, 2010 8:25 AM  Follow-up for Phone Call        needs PA or doc o the day visit.  Should go back up on prednisone to 33m twice daily for now. Follow-up by: DMilus BanisterMD,  May 26, 2010 9:23 AM  Additional Follow-up for Phone Call Additional follow up Details #1::        pt aware appt today with Amy prednisone sent to pharmacy Additional Follow-up by: PChristian MateCMA (Deborra Medina,  May 26, 2010 9:34 AM    New/Updated Medications: PREDNISONE 10 MG  TABS (PREDNISONE) take 2 pills  twice a day Prescriptions: PREDNISONE 10 MG  TABS (PREDNISONE) take 2 pills  twice a day  #120 x 6   Entered by:   PChristian MateCMA (AChristopher   Authorized by:   DMilus BanisterMD   Signed by:   PChristian MateCMA (AAtmore on 05/26/2010   Method used:   Electronically to        [facility] [address](retail)       [address]  [id number]      Ph: [phone]      Fax: [phone]  RxID:   [id number]

## 2010-07-28 NOTE — Consult Note (Addendum)
Summary: Gastroenterology/[facility]  Gastroenterology/[facility]   Imported By: Phillis Knack 07/22/2010 12:29:14  _____________________________________________________________________  External Attachment:    Type:   Image     Comment:   External Document

## 2010-08-23 LAB — DIFFERENTIAL
Basophils Relative: 0 % (ref 0–1)
Eosinophils Absolute: 0 10*3/uL (ref 0.0–0.7)
Lymphs Abs: 0.4 10*3/uL — ABNORMAL LOW (ref 0.7–4.0)
Monocytes Relative: 6 % (ref 3–12)
Neutro Abs: 13.2 10*3/uL — ABNORMAL HIGH (ref 1.7–7.7)

## 2010-08-23 LAB — CBC
MCH: 31.1 pg (ref 26.0–34.0)
MCHC: 35.8 g/dL (ref 30.0–36.0)
RBC: 5.3 MIL/uL (ref 4.22–5.81)
WBC: 14.6 10*3/uL — ABNORMAL HIGH (ref 4.0–10.5)

## 2010-08-23 LAB — URINALYSIS, ROUTINE W REFLEX MICROSCOPIC
Glucose, UA: NEGATIVE mg/dL
Ketones, ur: >80 mg/dL — AB
pH: 6 (ref 5.0–8.0)

## 2010-08-23 LAB — COMPREHENSIVE METABOLIC PANEL
AST: 29 U/L (ref 0–37)
Albumin: 4.8 g/dL (ref 3.5–5.2)
Alkaline Phosphatase: 36 U/L — ABNORMAL LOW (ref 39–117)
BUN: 15 mg/dL (ref 6–23)
Chloride: 103 mEq/L (ref 96–112)
Creatinine, Ser: 1.21 mg/dL (ref 0.4–1.5)
GFR calc non Af Amer: >60 mL/min (ref >60)
Total Bilirubin: 1.4 mg/dL — ABNORMAL HIGH (ref 0.3–1.2)

## 2010-08-23 LAB — BASIC METABOLIC PANEL
BUN: 15 mg/dL (ref 6–23)
Calcium: 9.8 mg/dL (ref 8.4–10.5)

## 2011-02-17 ENCOUNTER — Ambulatory Visit (INDEPENDENT_AMBULATORY_CARE_PROVIDER_SITE_OTHER): Payer: PRIVATE HEALTH INSURANCE | Admitting: Family Medicine

## 2011-02-17 ENCOUNTER — Encounter: Payer: Self-pay | Admitting: Family Medicine

## 2011-02-17 VITALS — BP 116/78 | HR 76 | Temp 97.9°F | Ht 72.0 in | Wt 186.5 lb

## 2011-02-17 DIAGNOSIS — R220 Localized swelling, mass and lump, head: Secondary | ICD-10-CM

## 2011-02-17 DIAGNOSIS — J329 Chronic sinusitis, unspecified: Secondary | ICD-10-CM

## 2011-02-17 DIAGNOSIS — R221 Localized swelling, mass and lump, neck: Secondary | ICD-10-CM

## 2011-02-17 DIAGNOSIS — J029 Acute pharyngitis, unspecified: Secondary | ICD-10-CM

## 2011-02-17 DIAGNOSIS — K1379 Other lesions of oral mucosa: Secondary | ICD-10-CM

## 2011-02-17 MED ORDER — FLUTICASONE PROPIONATE 50 MCG/ACT NA SUSP: 2.0000 | Freq: Every day | NASAL | Status: DC

## 2011-02-17 MED ORDER — DOXYCYCLINE HYCLATE 100 MG PO CAPS: 100.0000 mg | ORAL_CAPSULE | Freq: Two times a day (BID) | ORAL | Status: AC

## 2011-02-17 NOTE — Assessment & Plan Note (Signed)
Not location of LAD. ? Salivary duct obstructed. rec suck on hard candy, sour candy.  If not resolving in 3-4 wks to return for further evaluation.

## 2011-02-17 NOTE — Progress Notes (Signed)
  Subjective:    Patient ID: Russell [name], male    DOB: 05-03-1986, 25 y.o.   MRN: [id number]  HPI CC: ST  5d h/o ST, nasal congestion, yellow mucous from nose.  + HA described as pressure pain frontal bilateral.  Has tried pseudophed and mucinex.  Staying well hydrated.  No cough, fevers/chills, abd pain, n/v, ear pain or tooth pain.  Son was sick earlier this week.  No h/o asthma, no smokers at home.  Also noted lump in cheek 2 wks ago that hasn't resolved on own.  Denies trauma or bite to cheek.  Review of Systems Per HPI    Objective:   Physical Exam  Nursing note and vitals reviewed. Constitutional: He appears well-developed and well-nourished. No distress.  HENT:  Head: Normocephalic and atraumatic.  Right Ear: Hearing, tympanic membrane, external ear and ear canal normal.  Left Ear: Hearing, tympanic membrane, external ear and ear canal normal.  Nose: Mucosal edema present. No rhinorrhea. Right sinus exhibits no maxillary sinus tenderness and no frontal sinus tenderness. Left sinus exhibits no maxillary sinus tenderness and no frontal sinus tenderness.  Mouth/Throat: Uvula is midline, oropharynx is clear and moist and mucous membranes are normal. No oropharyngeal exudate, posterior oropharyngeal edema, posterior oropharyngeal erythema or tonsillar abscesses.         Evidently congested. Right firm mass cheek with firm mass at border where upper and lower teeth meet  Eyes: Conjunctivae and EOM are normal. Pupils are equal, round, and reactive to light. No scleral icterus.  Neck: Normal range of motion. Neck supple.  Cardiovascular: Normal rate, regular rhythm, normal heart sounds and intact distal pulses.   No murmur heard. Pulmonary/Chest: Effort normal and breath sounds normal. No respiratory distress. He has no wheezes. He has no rales.  Musculoskeletal: Normal range of motion.  Lymphadenopathy:    He has no cervical adenopathy.  Skin: Skin is warm and dry. No rash  noted.  Psychiatric: He has a normal mood and affect.          Assessment & Plan:

## 2011-02-17 NOTE — Patient Instructions (Signed)
You have a sinus infection.  Most are viral to start out. Take medicine as prescribed: Flonase nasal steroid 2 sprays in each nostril daily in morning. Push fluids and plenty of rest. Nasal saline irrigation or neti pot to help drain sinuses. May use simple mucinex with plenty of fluid to help mobilize mucous. Return if fever >101.5, trouble opening/closing mouth, difficulty swallowing, or worsening. Antibiotic to hold on to incase any worsening (fever >101), or not improving after 10 days.

## 2011-02-17 NOTE — Assessment & Plan Note (Addendum)
Early acute sinusitis, likely viral. abx to hold on to in case not improving. flonase for congestion. See pt instructions.

## 2011-05-10 ENCOUNTER — Encounter: Payer: Self-pay | Admitting: Family Medicine

## 2011-05-10 ENCOUNTER — Ambulatory Visit (INDEPENDENT_AMBULATORY_CARE_PROVIDER_SITE_OTHER): Payer: PRIVATE HEALTH INSURANCE | Admitting: Family Medicine

## 2011-05-10 VITALS — BP 110/62 | HR 79 | Temp 98.1°F | Ht 72.0 in | Wt 186.5 lb

## 2011-05-10 DIAGNOSIS — N509 Disorder of male genital organs, unspecified: Secondary | ICD-10-CM

## 2011-05-10 DIAGNOSIS — N50811 Right testicular pain: Secondary | ICD-10-CM

## 2011-05-10 NOTE — Patient Instructions (Signed)
Dr. Jasmine December [facility] Go now  [facility] [address], [place] [phone]

## 2011-05-10 NOTE — Progress Notes (Signed)
  Patient Name: Russell [name] Date of Birth: [birth year] Age: 25 y.o. Medical Record Number: [id number] Gender: male  History of Present Illness:  JOSHAUA EPPLE is a 25 y.o. very pleasant male patient who presents with the following:  Sunday night -- started to get a crick in his neck. Now having pain in his muscles. Feels like has worked out four or five days in a row.   Primary complaint, however, is severe pain in his right testicle. He woke up this morning at about 5:30 AM due to testicular pain on the right. It is swollen. It is markedly tender to palpation. The patient did have intercourse last night and had some mild discomfort with Bonnita Nasuti. He has no known STD exposure. He is done with 2 partners lifelong. No pain with urinating or having a bowel movement. No penile discharge. Feels like right testicle.  Woke up at 5:30 AM.    Past Medical History, Surgical History, Social History, Family History, and Problem List have been reviewed in EHR and updated if relevant.  Review of Systems: ROS: GEN: Acute illness details above GI: Tolerating PO intake GU: maintaining adequate hydration and urination - and as above Pulm: No SOB Interactive and getting along well at home.  Otherwise, ROS is as per the HPI.   Physical Examination: Filed Vitals:   05/10/11 1218  BP: 110/62  Pulse: 79  Temp: 98.1 F (36.7 C)  TempSrc: Oral  Height: 6' (1.829 m)  Weight: 186 lb 8 oz (84.596 kg)     GEN: WDWN, NAD, Non-toxic, Alert & Oriented x 3 HEENT: Atraumatic, Normocephalic.  Ears and Nose: No external deformity. GU: Penis and shaft is normal. Right testicle is elevated and swollen compared to the left. It is markedly tender to palpation. The posterior aspect of the testicle is exquisitely tender to palpation. Left testicle is nontender to palpation. Rectal examination shows a normal-sized prostate without any enlargement or nodule. EXTR: No clubbing/cyanosis/edema NEURO: Normal  gait.  PSYCH: Normally interactive. Conversant. Not depressed or anxious appearing.  Calm demeanor.    Assessment and Plan: 1. Testicular pain, right     High level of clinical concern for severe right-sided acute onset testicle pain. While the patient was in the office I called [facility], and arranged for him to be seen emergently in their office by Dr. Jasmine December. They have ultrasound capability they are in their office. I discussed with the patient that with this case she had to be very concerned about potential testicular torsion which is a surgical emergency. Theoretically, epididymitis or something similar that could be the case, but urgent evaluation is needed in this case.

## 2011-05-12 ENCOUNTER — Encounter (HOSPITAL_BASED_OUTPATIENT_CLINIC_OR_DEPARTMENT_OTHER): Payer: Self-pay | Admitting: *Deleted

## 2011-05-12 ENCOUNTER — Emergency Department (HOSPITAL_BASED_OUTPATIENT_CLINIC_OR_DEPARTMENT_OTHER)
Admission: EM | Admit: 2011-05-12 | Discharge: 2011-05-12 | Disposition: A | Discharge status: Home / Self Care | Payer: PRIVATE HEALTH INSURANCE | Attending: Emergency Medicine | Admitting: Emergency Medicine

## 2011-05-12 DIAGNOSIS — IMO0001 Reserved for inherently not codable concepts without codable children: Secondary | ICD-10-CM | POA: Insufficient documentation

## 2011-05-12 DIAGNOSIS — K589 Irritable bowel syndrome without diarrhea: Secondary | ICD-10-CM | POA: Insufficient documentation

## 2011-05-12 DIAGNOSIS — M791 Myalgia, unspecified site: Secondary | ICD-10-CM

## 2011-05-12 LAB — CBC
MCH: 29.4 pg (ref 26.0–34.0)
MCV: 84.4 fL (ref 78.0–100.0)
Platelets: 193 10*3/uL (ref 150–400)
RBC: 5.27 MIL/uL (ref 4.22–5.81)
RDW: 12.6 % (ref 11.5–15.5)

## 2011-05-12 LAB — CK TOTAL AND CKMB (NOT AT ARMC): Relative Index: 2.4 (ref 0.0–2.5)

## 2011-05-12 LAB — COMPREHENSIVE METABOLIC PANEL
AST: 18 U/L (ref 0–37)
CO2: 30 mEq/L (ref 19–32)
GFR calc Af Amer: >90 mL/min (ref >90)
GFR calc non Af Amer: >90 mL/min (ref >90)
Sodium: 141 mEq/L (ref 135–145)
Total Bilirubin: 0.5 mg/dL (ref 0.3–1.2)
Total Protein: 7.4 g/dL (ref 6.0–8.3)

## 2011-05-12 LAB — DIFFERENTIAL
Basophils Absolute: 0 10*3/uL (ref 0.0–0.1)
Basophils Relative: 0 % (ref 0–1)
Eosinophils Absolute: 0.2 10*3/uL (ref 0.0–0.7)
Monocytes Absolute: 0.7 10*3/uL (ref 0.1–1.0)
Monocytes Relative: 10 % (ref 3–12)
Neutrophils Relative %: 56 % (ref 43–77)

## 2011-05-12 LAB — URINALYSIS, ROUTINE W REFLEX MICROSCOPIC
Glucose, UA: NEGATIVE mg/dL
Hgb urine dipstick: NEGATIVE
Ketones, ur: NEGATIVE mg/dL
Leukocytes, UA: NEGATIVE
pH: 8 (ref 5.0–8.0)

## 2011-05-12 MED ORDER — SODIUM CHLORIDE 0.9 % IV BOLUS (SEPSIS)
3000.0000 mL | Freq: Once | INTRAVENOUS | Status: AC
  Administered 2011-05-12: 1000 mL via INTRAVENOUS

## 2011-05-12 NOTE — ED Notes (Signed)
Patient states he developed generalized muscle aches  5 days ago with extreme fatigue.  3 days ago developed pain in his testicle and was seen and treated for epididymitis.  States he continues to had worsening muscle aches with  Sensitivity, an intermittent fever up to 101 and increasing fatigue.  Over the few weeks he has had some sinus drainage and mild sore throat.

## 2011-05-12 NOTE — ED Provider Notes (Signed)
History     CSN: [id number] Arrival date & time: 05/12/2011 10:50 AM   First MD Initiated Contact with Patient 05/12/11 1144      Chief Complaint  Patient presents with  . Generalized Body Aches    generalized x 5 days; treated for epidimyitis for the 3 days    (Consider location/radiation/quality/duration/timing/severity/associated sxs/prior treatment) HPI Patient with generalized body /muscle aches for 5 days.  Testicular pain began in right testicle on Wednesday.  Seen by pmd and sent to urologist.  Ultrasound revealed epididymitis and started on doxycycline.    Testicular pain is improving.  Continues to have diffuse muscle pain.  Feels generally weak.  Has had fever beginning Wednesday evening to 101.  Patient has had sore throat and sinus symptoms for several weeks.  No similar myalgias.  History of Crohn's .   Past Medical History  Diagnosis Date  . 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 Irritable bowel syndrome     Past Surgical History  Procedure Date  . Tonsillectomy and adenoidectomy 1990's  . Colonoscopy 7/11    Dr. Ardis Hughs    Family History  Problem Relation Age of Onset  . Healthy Father   . Healthy Mother   . Crohn's disease Cousin   . Coronary artery disease Neg Hx   . Diabetes Neg Hx   . Hypertension      family hx    History  Substance Use Topics  . Smoking status: Never Smoker   . Smokeless tobacco: Not on file  . Alcohol Use: No      Review of Systems  All other systems reviewed and are negative.    Allergies  Amoxicillin  Home Medications  No current outpatient prescriptions on file.  BP 127/83  Pulse 78  Temp(Src) 97.7 F (36.5 C) (Oral)  Resp 20  Ht 6' (1.829 m)  Wt 186 lb (84.369 kg)  BMI 25.23 kg/m2  SpO2 100%  Physical Exam  Nursing note and vitals reviewed. Constitutional: He appears well-developed and well-nourished.  HENT:  Head: Normocephalic and atraumatic.  Eyes: Conjunctivae and EOM  are normal. Pupils are equal, round, and reactive to light.  Neck: Normal range of motion. Neck supple.  Cardiovascular: Normal rate and regular rhythm.   Pulmonary/Chest: Effort normal and breath sounds normal.  Abdominal: Soft. Bowel sounds are normal.  Genitourinary: Penis normal.       Right Testicle tender, no swelling or redness  Musculoskeletal: Normal range of motion.  Neurological: He is alert.  Skin: Skin is warm and dry.  Psychiatric: He has a normal mood and affect.    ED Course  Procedures (including critical care time)  Labs Reviewed - No data to display No results found.   No diagnosis found.    Patient with normal labs and iv rehydration given.  Patient feel improved and has good strength.          Shaune Pollack, MD 05/12/11 1346

## 2012-01-10 ENCOUNTER — Encounter (HOSPITAL_COMMUNITY): Payer: Self-pay | Admitting: Emergency Medicine

## 2012-01-10 ENCOUNTER — Emergency Department (HOSPITAL_COMMUNITY): Payer: BC Managed Care – PPO

## 2012-01-10 DIAGNOSIS — R079 Chest pain, unspecified: Secondary | ICD-10-CM | POA: Insufficient documentation

## 2012-01-10 DIAGNOSIS — M25519 Pain in unspecified shoulder: Secondary | ICD-10-CM | POA: Insufficient documentation

## 2012-01-10 LAB — BASIC METABOLIC PANEL
CO2: 31 mEq/L (ref 19–32)
Chloride: 103 mEq/L (ref 96–112)
Creatinine, Ser: 0.91 mg/dL (ref 0.50–1.35)
Glucose, Bld: 93 mg/dL (ref 70–99)

## 2012-01-10 LAB — CBC WITH DIFFERENTIAL/PLATELET
Eosinophils Relative: 1 % (ref 0–5)
HCT: 43.5 % (ref 39.0–52.0)
Lymphocytes Relative: 27 % (ref 12–46)
Lymphs Abs: 2.5 10*3/uL (ref 0.7–4.0)
MCV: 84.3 fL (ref 78.0–100.0)
Monocytes Absolute: 0.7 10*3/uL (ref 0.1–1.0)
RBC: 5.16 MIL/uL (ref 4.22–5.81)
RDW: 12.3 % (ref 11.5–15.5)
WBC: 9.2 10*3/uL (ref 4.0–10.5)

## 2012-01-10 NOTE — ED Notes (Signed)
PT. REPORTS MID CHEST PAIN / LEFT SHOULDER PAIN ONSET THIS EVENING WITH SLIGHT SOB , DENIES NAUSEA OR DIAPHORESIS.

## 2012-01-11 ENCOUNTER — Emergency Department (HOSPITAL_COMMUNITY)
Admission: EM | Admit: 2012-01-11 | Discharge: 2012-01-11 | Disposition: A | Discharge status: Home / Self Care | Payer: BC Managed Care – PPO | Attending: Emergency Medicine | Admitting: Emergency Medicine

## 2012-01-11 HISTORY — DX: Crohn's disease, unspecified, without complications: K50.90

## 2012-01-11 NOTE — ED Notes (Signed)
Pt not in waiting room when called.

## 2012-07-31 ENCOUNTER — Ambulatory Visit (INDEPENDENT_AMBULATORY_CARE_PROVIDER_SITE_OTHER): Payer: BC Managed Care – PPO | Admitting: Gastroenterology

## 2012-07-31 ENCOUNTER — Encounter: Payer: Self-pay | Admitting: Gastroenterology

## 2012-07-31 ENCOUNTER — Telehealth: Payer: Self-pay | Admitting: Gastroenterology

## 2012-07-31 ENCOUNTER — Other Ambulatory Visit (INDEPENDENT_AMBULATORY_CARE_PROVIDER_SITE_OTHER): Payer: BC Managed Care – PPO

## 2012-07-31 VITALS — BP 152/100 | HR 89 | Ht 72.0 in | Wt 186.0 lb

## 2012-07-31 DIAGNOSIS — R109 Unspecified abdominal pain: Secondary | ICD-10-CM

## 2012-07-31 DIAGNOSIS — K5090 Crohn's disease, unspecified, without complications: Secondary | ICD-10-CM

## 2012-07-31 DIAGNOSIS — R197 Diarrhea, unspecified: Secondary | ICD-10-CM

## 2012-07-31 LAB — COMPREHENSIVE METABOLIC PANEL
AST: 19 U/L (ref 0–37)
Alkaline Phosphatase: 35 U/L — ABNORMAL LOW (ref 39–117)
BUN: 12 mg/dL (ref 6–23)
Calcium: 9.5 mg/dL (ref 8.4–10.5)
Chloride: 103 mEq/L (ref 96–112)
Creatinine, Ser: 0.9 mg/dL (ref 0.4–1.5)

## 2012-07-31 LAB — CBC WITH DIFFERENTIAL/PLATELET
Basophils Relative: 0.2 % (ref 0.0–3.0)
Eosinophils Absolute: 0.1 10*3/uL (ref 0.0–0.7)
Hemoglobin: 14.4 g/dL (ref 13.0–17.0)
MCHC: 34.5 g/dL (ref 30.0–36.0)
MCV: 85.3 fl (ref 78.0–100.0)
Monocytes Absolute: 0.6 10*3/uL (ref 0.1–1.0)
Neutro Abs: 5.4 10*3/uL (ref 1.4–7.7)
RBC: 4.91 Mil/uL (ref 4.22–5.81)

## 2012-07-31 LAB — C-REACTIVE PROTEIN: CRP: <0.5 mg/dL (ref 0.5–20.0)

## 2012-07-31 MED ORDER — MESALAMINE ER 500 MG PO CPCR: 1000.0000 mg | ORAL_CAPSULE | Freq: Four times a day (QID) | ORAL | Status: DC

## 2012-07-31 NOTE — Telephone Encounter (Signed)
Pt has diarrhea saw  bright red blood last night, nausea and abd pain.  Started 3 weeks ago and not getting any better.  Scheduled to see Janett Billow today at 330 pm

## 2012-07-31 NOTE — Patient Instructions (Addendum)
Your physician has requested that you go to the basement for the lab work before leaving today. Please follow up with Dr Ardis Hughs in 2-3 weeks. CC:  Owens Loffler MD

## 2012-08-01 ENCOUNTER — Encounter: Payer: Self-pay | Admitting: Gastroenterology

## 2012-08-01 DIAGNOSIS — R109 Unspecified abdominal pain: Secondary | ICD-10-CM | POA: Insufficient documentation

## 2012-08-01 DIAGNOSIS — R197 Diarrhea, unspecified: Secondary | ICD-10-CM | POA: Insufficient documentation

## 2012-08-01 DIAGNOSIS — K5090 Crohn's disease, unspecified, without complications: Secondary | ICD-10-CM | POA: Insufficient documentation

## 2012-08-01 NOTE — Progress Notes (Signed)
08/01/2012 Russell [name] [id number] 06-Sep-1985   History of Present Illness:  Patient is a 27 year old male who is a patient of Dr. Ardis Hughs.  He has a history of Crohns ileitis and possibly some overlapping IBS, but has been very reluctant to take any medication for his conditions.  He was last seen here at the end of 2011 after his colonoscopy, which diagnosed the Crohns.  He had requested a second opinion so was sent to see Dr. Renee Harder at [facility].  He saw Dr. Renee Harder on two occasions, in initial consultation and then for one follow-up and he suggested several different treatment options to the patient as well, however, he declined treatment at that time.  It has been approximately two years sine Russell [name] has seen a physician regarding his condition.  He comes in today stating that for the last 3-4 weeks his symptoms have been worsening, but he is not sure if it is the Crohns or if it is IBS, or both.  He is having approximately 5 BM's a day that are always loose-watery with a lot of urgency.  Cramping abdominal pain that is worse when having a BM but relieves significantly after moving his bowels.  Says that his abdomen just feels sore all over.  Not preventing him from working or sleeping.  Saw some blood mixed with the stool for the first time the other day; never had this before.  Appetite is fine.  No unexpected weight loss.  Says that other than the blood, these are the same symptoms that he has been dealing with for the last few years, but just worsened recently.  No nausea, vomiting, fevers, or chills.  Did not like taking entocort or prednisone in the past (these are the only two medications that he has taken) because of the side effects.  Is very reluctant at this point to take immunomodulators or biologics due to risk of lymphoma; he knows that the risk of lymphoma is very low, however, he says that the medications and other complications/side effects scare him and make him  uncomfortable.  Current Medications, Allergies, Past Medical History, Past Surgical History, Family History and Social History were reviewed in [employer] record.   Physical Exam: BP 152/100  Pulse 89  Ht 6' (1.829 m)  Wt 186 lb (84.369 kg)  BMI 25.22 kg/m2  SpO2 99% General: Well developed, white male in no acute distress Head: Normocephalic and atraumatic Eyes:  sclerae anicteric, conjunctiva pink  Ears: Normal auditory acuity Lungs: Clear throughout to auscultation Heart: Regular rate and rhythm Abdomen: Soft, non-distended. No masses, no hepatomegaly. Normal to slightly hyperactive BS's.  Diffuse TTP> in the lower abdomen without R/R/G. Rectal: Deferred.   Musculoskeletal: Symmetrical with no gross deformities  Extremities: No edema  Neurological: Alert oriented x 4, grossly nonfocal Psychological:  Alert and cooperative. Normal mood and affect  Assessment and Recommendations: -Abdominal pain and diarrhea with history of Crohns ileitis and possibly some overlapping IBS.  Not currently on any medication.  Symptoms worsening over last few weeks.  Will check some labs:  CBC, CMP, sed rate, and CRP.  Patient is very reluctant to take ANY medication, particularly prednisone and entocort due to side effects in the past, and immunomodulators and biologics due to risk of lymphoma, etc (even though he does know that the lymphoma is very rare).  He ultimately agreed to try a mesalamine, which was suggested by Dr. Renee Harder at [facility].  The only issue is that his  Crohns was initially just affecting his small bowel, and the majority of mesalamines target the colon.  He will try Pentasa 4 grams daily for the next few weeks.  Will return to see Dr. Ardis Hughs for further discussion and treatment decisons.  May need repeat imaging and possibly addition of antispasmodic for symptomatic treatment.

## 2012-08-02 NOTE — Progress Notes (Signed)
I agree with the plan outlined in this note

## 2012-08-20 ENCOUNTER — Encounter: Payer: Self-pay | Admitting: Gastroenterology

## 2012-08-20 ENCOUNTER — Ambulatory Visit (INDEPENDENT_AMBULATORY_CARE_PROVIDER_SITE_OTHER): Payer: BC Managed Care – PPO | Admitting: Gastroenterology

## 2012-08-20 VITALS — BP 100/60 | HR 68 | Ht 71.5 in | Wt 182.2 lb

## 2012-08-20 DIAGNOSIS — K5090 Crohn's disease, unspecified, without complications: Secondary | ICD-10-CM

## 2012-08-20 MED ORDER — HYOSCYAMINE SULFATE 0.125 MG SL SUBL: SUBLINGUAL_TABLET | SUBLINGUAL | Status: DC

## 2012-08-20 MED ORDER — MESALAMINE 1.2 G PO TBEC: 4.8000 g | DELAYED_RELEASE_TABLET | Freq: Every day | ORAL | Status: DC

## 2012-08-20 MED ORDER — ESOMEPRAZOLE MAGNESIUM 40 MG PO CPDR: 40.0000 mg | DELAYED_RELEASE_CAPSULE | Freq: Two times a day (BID) | ORAL | Status: DC

## 2012-08-20 NOTE — Progress Notes (Signed)
Review of gastrointestinal problems:  1. Crohn's disease. Presented with diarrhea, urgency, right lower quadrant discomfort. Sedimentation rate normal. Colonoscopy July 2011 found normal colon, terminal ileum was ulcerated, inflamed. Biopsies showed active, chronic ileitis. Small bowel follow-through July 2011 confirmed inflammation in terminal ileum, otherwise was normal. August, 2011 CT scan showed no clear bowel disease however there was some right lower quadrant Subcentimeter lymph nodes . [facility] testing July 2011 was "not consistent with IBD," TPMT phenotype was normal. August, 2011 Entocort caused worse cramping, nausea. September, 2011 prednisone 40 mg a day greatly helped his symptoms however he could not taper past 25-30 mg a day.  04/2010 Evaluated by Antony Blackbird at [facility], was recommended to start immunomodulators but he declined, offered mesalamine and he agreed.  Lost to follow up for about 2 years.  06/2012 represented off meds for 2 years, diarrhea, CBC, CMET, sed rate all normal, started pentasa  with a very good response quickly.   HPI: This is a    very pleasant 27 year old man whom I last saw about 2 years ago.   Was doing "pretty good" for about 2 years.  No serious urgency.  Daily loose stools, 3-4 per day, once weekly nocturnal symptoms. Treated with imodium, averaging one per day which treated the urgency quite well.  Progressively cramping in abd, pain in RLQ. Urgency came back, needed increasing doses of imodium (3-4 per day).  Then saw some bleeding.  Started pentasa 4 pills 4 times per day.  This has tremendously helped.  Diarrhea is gone, urgency is gone.  No longer on imodium.  Had voice trouble several months ago, saw ENT, felt it was GERD related.  Takes omeprazole. Bid, breakfast 30-60.  Drinks 1 to 1/12 pot coffee per day.     Past Medical History  Diagnosis Date  . 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 Irritable bowel syndrome    . Crohn's disease     Past Surgical History  Procedure Laterality Date  . Tonsillectomy and adenoidectomy  1990's  . Colonoscopy  7/11    Dr. Ardis Hughs    Current Outpatient Prescriptions  Medication Sig Dispense Refill  . mesalamine (PENTASA) 500 MG CR capsule Take 2 capsules (1,000 mg total) by mouth 4 (four) times daily.  240 capsule  3  . omeprazole (PRILOSEC) 20 MG capsule Take 20 mg by mouth 2 (two) times daily.       No current facility-administered medications for this visit.    Allergies as of 08/20/2012 - Review Complete 08/20/2012  Allergen Reaction Noted  . Amoxicillin Rash 08/09/2007    Family History  Problem Relation Age of Onset  . Healthy Father   . Healthy Mother   . Crohn's disease Cousin   . Coronary artery disease Neg Hx   . Diabetes Neg Hx   . Hypertension      family hx    History   Social History  . Marital Status: Married    Spouse Name: N/A    Number of Children: 1  . Years of Education: N/A   Occupational History  . 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 Smoking status: Never Smoker   . Smokeless tobacco: Never Used  . Alcohol Use: No  . Drug Use: No  . Sexually Active: Not on file   Other Topics Concern  . Not on file   Social History Narrative   Married      1  son      Museum/gallery conservator; Psychologist, counselling            Physical Exam: BP 100/60  Pulse 68  Ht 5' 11.5" (1.816 m)  Wt 182 lb 4 oz (82.668 kg)  BMI 25.07 kg/m2 Constitutional: generally well-appearing Psychiatric: alert and oriented x3 Abdomen: soft, very mild tenderness in the right lower quadrant only, nondistended, no obvious ascites, no peritoneal signs, normal bowel sounds     Assessment and plan: 27 y.o. male with Crohn's disease  Previously his Crohn's has been documented only in the small bowel. He is acting more as if he has some colonic involvement lately. He responded fairly quickly to mesalamine. I'm going to change into a  formulation that is once daily dosing instead of 4 times daily dosing. He has some mild cramping and we'll try antispasmodics. I'm also giving him a new, prescription strength antiacid medicine. He'll return to see me in 4-5 weeks and sooner if needed. We discussed repeating colonoscopy if he fails to continue a good response to mesalamine product.

## 2012-08-20 NOTE — Patient Instructions (Addendum)
Switch to nexium, new script called in. Take 1 pill 20-30 min before BF and dinner meals. Change to lialda mesalamine, given it's once daily dosing. Trial of levsin anti spasm medicine, take with cramps. Return to see Dr. Ardis Hughs in 4-5 weeks.                                               We are excited to introduce MyChart, a new best-in-class service that provides you online access to important information in your electronic medical record. We want to make it easier for you to view your health information - all in one secure location - when and where you need it. We expect MyChart will enhance the quality of care and service we provide.  When you register for MyChart, you can:    View your test results.    Request appointments and receive appointment reminders via email.    Request medication renewals.    View your medical history, allergies, medications and immunizations.    Communicate with your physician's office through a password-protected site.    Conveniently print information such as your medication lists.  To find out if MyChart is right for you, please talk to a member of our clinical staff today. We will gladly answer your questions about this free health and wellness tool.  If you are age 42 or older and want a member of your family to have access to your record, you must provide written consent by completing a proxy form available at our office. Please speak to our clinical staff about guidelines regarding accounts for patients younger than age 68.  As you activate your MyChart account and need any technical assistance, please call the MyChart technical support line at [phone]) or email your question to [email or web address]. If you email your question(s), please include your name, a return phone number and the best time to reach you.  If you have non-urgent health-related questions, you can send a message to our office through [place] at  [email or web address]. If you have a medical emergency, call 911.  Thank you for using MyChart as your new health and wellness resource!   MyChart licensed from [employer],  1999-2010. Patents Pending.

## 2012-10-01 ENCOUNTER — Ambulatory Visit (INDEPENDENT_AMBULATORY_CARE_PROVIDER_SITE_OTHER): Payer: BC Managed Care – PPO | Admitting: Gastroenterology

## 2012-10-01 ENCOUNTER — Encounter: Payer: Self-pay | Admitting: Gastroenterology

## 2012-10-01 VITALS — BP 110/64 | HR 76 | Ht 71.5 in | Wt 187.4 lb

## 2012-10-01 DIAGNOSIS — K5010 Crohn's disease of large intestine without complications: Secondary | ICD-10-CM

## 2012-10-01 MED ORDER — MOVIPREP 100 G PO SOLR: 1.0000 | Freq: Once | ORAL | Status: DC

## 2012-10-01 NOTE — Progress Notes (Signed)
Review of gastrointestinal problems:  1. Crohn's disease. Presented with diarrhea, urgency, right lower quadrant discomfort. Sedimentation rate normal. Colonoscopy July 2011 found normal colon, terminal ileum was ulcerated, inflamed. Biopsies showed active, chronic ileitis. Small bowel follow-through July 2011 confirmed inflammation in terminal ileum, otherwise was normal. August, 2011 CT scan showed no clear bowel disease however there was some right lower quadrant Subcentimeter lymph nodes . Prometheus testing July 2011 was "not consistent with IBD," TPMT phenotype was normal. August, 2011 Entocort caused worse cramping, nausea. September, 2011 prednisone 40 mg a day greatly helped his symptoms however he could not taper past 25-30 mg a day. 04/2010 Evaluated by Antony Blackbird at [facility], was recommended to start immunomodulators but he declined, offered mesalamine and he agreed. Lost to follow up for about 2 years. 06/2012 represented off meds for 2 years, diarrhea, CBC, CMET, sed rate all normal, started pentasa with a very good response quickly.  April, 2014: Response to mesalamine product has waned.   HPI: This is a very pleasant 27 year old man whom I last saw about 5-6 weeks ago  Can really alternate with BMs.  Has 3-5 loose stools per day   More concerned with cramping.    Antispasm med helps with urgency, takes once every two days, sublingual.  He was able to completely stop caffeine by weaning off it gradually. This did not affect his diarrhea but significantly improved his heartburn to the point where he needed no proton pump inhibitor  Past Medical History  Diagnosis Date  . 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 Irritable bowel syndrome   . Crohn's disease     Past Surgical History  Procedure Laterality Date  . Tonsillectomy and adenoidectomy  1990's  . Colonoscopy  7/11    Dr. Ardis Hughs    Current Outpatient Prescriptions  Medication Sig Dispense Refill   . hyoscyamine (LEVSIN SL) 0.125 MG SL tablet 1-2 every 6 hours as needed  45 tablet  11  . mesalamine (LIALDA) 1.2 G EC tablet Take 4 tablets (4.8 g total) by mouth daily with breakfast.  120 tablet  11   No current facility-administered medications for this visit.    Allergies as of 10/01/2012 - Review Complete 10/01/2012  Allergen Reaction Noted  . Amoxicillin Rash 08/09/2007    Family History  Problem Relation Age of Onset  . Healthy Father   . Healthy Mother   . Crohn's disease Cousin   . Coronary artery disease Neg Hx   . Diabetes Neg Hx   . Hypertension      family hx  . Colon cancer Maternal Grandfather   . Colon polyps Maternal Grandfather     History   Social History  . Marital Status: Married    Spouse Name: N/A    Number of Children: 1  . Years of Education: N/A   Occupational History  . 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 Smoking status: Never Smoker   . Smokeless tobacco: Never Used  . Alcohol Use: No  . Drug Use: No  . Sexually Active: Not on file   Other Topics Concern  . Not on file   Social History Narrative   Married      1 son      Museum/gallery conservator; [employer]            Physical Exam: BP 110/64  Pulse 76  Ht 5' 11.5" (1.816 m)  Wt 187 lb  6.4 oz (85.004 kg)  BMI 25.78 kg/m2 Constitutional: generally well-appearing Psychiatric: alert and oriented x3 Abdomen: soft, nontender, nondistended, no obvious ascites, no peritoneal signs, normal bowel sounds     Assessment and plan: 27 y.o. male with Crohn's disease, ileal  I think we need to redefine his Crohn's disease. He is not having the same response to mesalamine products as he did previously concern about more extensive, severe disease. We'll consider immunomodulators in the past which he has been reluctant to try however currently he says he may be more willing if the disease severity indicates. He will also try single Imodium every day and continue on Lee  although up until the time of the colonoscopy.

## 2012-10-01 NOTE — Patient Instructions (Addendum)
You will be set up for a colonoscopy (LEC, moderate sedation) for diarrhea, crohn's disease. Congrats on stopping coffee. Start one imodium, OTC, shortly after waking every morning.                                               We are excited to introduce MyChart, a new best-in-class service that provides you online access to important information in your electronic medical record. We want to make it easier for you to view your health information - all in one secure location - when and where you need it. We expect MyChart will enhance the quality of care and service we provide.  When you register for MyChart, you can:    View your test results.    Request appointments and receive appointment reminders via email.    Request medication renewals.    View your medical history, allergies, medications and immunizations.    Communicate with your physician's office through a password-protected site.    Conveniently print information such as your medication lists.  To find out if MyChart is right for you, please talk to a member of our clinical staff today. We will gladly answer your questions about this free health and wellness tool.  If you are age 76 or older and want a member of your family to have access to your record, you must provide written consent by completing a proxy form available at our office. Please speak to our clinical staff about guidelines regarding accounts for patients younger than age 37.  As you activate your MyChart account and need any technical assistance, please call the MyChart technical support line at [phone]) [phone]-CHART [phone]) or email your question to [email or web address]. If you email your question(s), please include your name, a return phone number and the best time to reach you.  If you have non-urgent health-related questions, you can send a message to our office through [place] at [email or web address]. If you have a medical emergency, call  911.  Thank you for using MyChart as your new health and wellness resource!   MyChart licensed from [employer],  1999-2010. Patents Pending.

## 2012-10-08 ENCOUNTER — Ambulatory Visit (AMBULATORY_SURGERY_CENTER): Payer: BC Managed Care – PPO | Admitting: Gastroenterology

## 2012-10-08 ENCOUNTER — Encounter: Payer: Self-pay | Admitting: Gastroenterology

## 2012-10-08 VITALS — BP 108/56 | HR 65 | Temp 97.2°F | Resp 16 | Ht 71.0 in | Wt 187.0 lb

## 2012-10-08 DIAGNOSIS — R197 Diarrhea, unspecified: Secondary | ICD-10-CM

## 2012-10-08 DIAGNOSIS — K5010 Crohn's disease of large intestine without complications: Secondary | ICD-10-CM

## 2012-10-08 DIAGNOSIS — K5289 Other specified noninfective gastroenteritis and colitis: Secondary | ICD-10-CM

## 2012-10-08 DIAGNOSIS — K5000 Crohn's disease of small intestine without complications: Secondary | ICD-10-CM

## 2012-10-08 MED ORDER — BUDESONIDE 3 MG PO CP24: 9.0000 mg | ORAL_CAPSULE | Freq: Every day | ORAL | Status: DC

## 2012-10-08 MED ORDER — SODIUM CHLORIDE 0.9 % IV SOLN: 500.0000 mL | INTRAVENOUS | Status: DC

## 2012-10-08 NOTE — Patient Instructions (Addendum)
Discharge instructions given with verbal understanding. Biopsies taken. Resume previous medications. YOU HAD AN ENDOSCOPIC PROCEDURE TODAY AT [facility]: Refer to the procedure report that was given to you for any specific questions about what was found during the examination.  If the procedure report does not answer your questions, please call your gastroenterologist to clarify.  If you requested that your care partner not be given the details of your procedure findings, then the procedure report has been included in a sealed envelope for you to review at your convenience later.  YOU SHOULD EXPECT: Some feelings of bloating in the abdomen. Passage of more gas than usual.  Walking can help get rid of the air that was put into your GI tract during the procedure and reduce the bloating. If you had a lower endoscopy (such as a colonoscopy or flexible sigmoidoscopy) you may notice spotting of blood in your stool or on the toilet paper. If you underwent a bowel prep for your procedure, then you may not have a normal bowel movement for a few days.  DIET: Your first meal following the procedure should be a light meal and then it is ok to progress to your normal diet.  A half-sandwich or bowl of soup is an example of a good first meal.  Heavy or fried foods are harder to digest and may make you feel nauseous or bloated.  Likewise meals heavy in dairy and vegetables can cause extra gas to form and this can also increase the bloating.  Drink plenty of fluids but you should avoid alcoholic beverages for 24 hours.  ACTIVITY: Your care partner should take you home directly after the procedure.  You should plan to take it easy, moving slowly for the rest of the day.  You can resume normal activity the day after the procedure however you should NOT DRIVE or use heavy machinery for 24 hours (because of the sedation medicines used during the test).    SYMPTOMS TO REPORT IMMEDIATELY: A gastroenterologist  can be reached at any hour.  During normal business hours, 8:30 AM to 5:00 PM Monday through Friday, call [phone].  After hours and on weekends, please call the GI answering service at [phone] who will take a message and have the physician on call contact you.   Following lower endoscopy (colonoscopy or flexible sigmoidoscopy):  Excessive amounts of blood in the stool  Significant tenderness or worsening of abdominal pains  Swelling of the abdomen that is new, acute  Fever of 100F or higher  FOLLOW UP: If any biopsies were taken you will be contacted by phone or by letter within the next 1-3 weeks.  Call your gastroenterologist if you have not heard about the biopsies in 3 weeks.  Our staff will call the home number listed on your records the next business day following your procedure to check on you and address any questions or concerns that you may have at that time regarding the information given to you following your procedure. This is a courtesy call and so if there is no answer at the home number and we have not heard from you through the emergency physician on call, we will assume that you have returned to your regular daily activities without incident.  SIGNATURES/CONFIDENTIALITY: You and/or your care partner have signed paperwork which will be entered into your electronic medical record.  These signatures attest to the fact that that the information above on your After Visit Summary has been reviewed  and is understood.  Full responsibility of the confidentiality of this discharge information lies with you and/or your care-partner.

## 2012-10-08 NOTE — Progress Notes (Signed)
The pt tolerated the colonoscopy very well. Maw   

## 2012-10-08 NOTE — Progress Notes (Signed)
Patient did not experience any of the following events: a burn prior to discharge; a fall within the facility; wrong site/side/patient/procedure/implant event; or a hospital transfer or hospital admission upon discharge from the facility. (G8907) Patient did not have preoperative order for IV antibiotic SSI prophylaxis. (G8918)  

## 2012-10-08 NOTE — Op Note (Signed)
[facility]  [address] [name], [id number]   COLONOSCOPY PROCEDURE REPORT  PATIENT: [name], Russell  MR#: [id number] BIRTHDATE: October 02, 1985 , 26  yrs. old GENDER: Male ENDOSCOPIST: Milus Banister, MD PROCEDURE DATE:  10/08/2012 PROCEDURE:   Colonoscopy with biopsy ASA CLASS:   Class I INDICATIONS:Presented with diarrhea, urgency, right lower quadrant discomfort.  Sedimentation rate normal.  Colonoscopy July 2011 found normal colon, terminal ileum was ulcerated, inflamed. Biopsies showed active, chronic ileitis.  Small bowel follow-through July 2011 confirmed inflammation in terminal ileum, otherwise was normal.  August, 2011 CT scan showed no clear bowel disease however there was some right lower quadrant Subcentimeter lymph nodes .  Prometheus testing July 2011 was "not consistent with IBD," TPMT phenotype was normal.  August, 2011 Entocort caused worse cramping, nausea.  September, 2011 prednisone 40 mg a day greatly helped his symptoms however he could not taper past 25-30 mg a day.  04/2010 Evaluated by Antony Blackbird at [facility], was recommended to start immunomodulators but he declined, offered mesalamine and he agreed.  Lost to follow up for about 2 years. 06/2012 represented off meds for 2 years, diarrhea, CBC, CMET, sed rate all normal, started pentasa with a very good response quickly. April, 2014: Response to mesalamine product has waned.Marland Kitchen MEDICATIONS: Fentanyl 75 mcg IV, Versed 8 mg IV, and These medications were titrated to patient response per physician's verbal order  DESCRIPTION OF PROCEDURE:   After the risks benefits and alternatives of the procedure were thoroughly explained, informed consent was obtained.  A digital rectal exam revealed no abnormalities of the rectum.   The LB [id number]  endoscope was introduced through the anus and advanced to the terminal ileum which was intubated for a short distance. No adverse events experienced.    The quality of the prep was good.  The instrument was then slowly withdrawn as the colon was fully examined.  COLON FINDINGS: There were several scattered erosions and small ulcers in terminal ileum (moderate inflammation).  This was biopsied and sent to pathology.  The colonic mucosa was normal. Retroflexed views revealed no abnormalities. The time to cecum=3 minutes 25 seconds.  Withdrawal time=7 minutes 19 seconds.  The scope was withdrawn and the procedure completed. COMPLICATIONS: There were no complications. ENDOSCOPIC IMPRESSION: Moderate terminal ileitis, biopsied The colonic mucosa was normal.  RECOMMENDATIONS: Trial of entocort (36m once daily).  This was called into your pharmacy.  My office will contact you about follow up appt in about 3-4 weeks.  Continue on lialda at current dose.  You can stop the imodium.   eSigned:  DMilus Banister MD 10/08/2012 11:06 AM

## 2012-10-09 ENCOUNTER — Telehealth: Payer: Self-pay

## 2012-10-09 NOTE — Telephone Encounter (Signed)
  Follow up Call-  Call back number 10/08/2012  Post procedure Call Back phone  # [phone]  Permission to leave phone message Yes     Patient questions:  Do you have a fever, pain , or abdominal swelling? no Pain Score  0 *  Have you tolerated food without any problems? yes  Have you been able to return to your normal activities? yes  Do you have any questions about your discharge instructions: Diet   no Medications  no Follow up visit  no  Do you have questions or concerns about your Care? no  Actions: * If pain score is 4 or above: No action needed, pain <4.   No problems per the pt. Maw

## 2012-10-16 ENCOUNTER — Encounter: Payer: Self-pay | Admitting: Gastroenterology

## 2012-11-12 ENCOUNTER — Ambulatory Visit (INDEPENDENT_AMBULATORY_CARE_PROVIDER_SITE_OTHER): Payer: BC Managed Care – PPO | Admitting: Gastroenterology

## 2012-11-12 ENCOUNTER — Ambulatory Visit (INDEPENDENT_AMBULATORY_CARE_PROVIDER_SITE_OTHER)
Admission: RE | Admit: 2012-11-12 | Discharge: 2012-11-12 | Disposition: A | Discharge status: Home / Self Care | Payer: BC Managed Care – PPO | Source: Ambulatory Visit | Attending: Gastroenterology | Admitting: Gastroenterology

## 2012-11-12 ENCOUNTER — Encounter: Payer: Self-pay | Admitting: Gastroenterology

## 2012-11-12 ENCOUNTER — Other Ambulatory Visit: Payer: BC Managed Care – PPO

## 2012-11-12 VITALS — BP 120/80 | HR 80 | Ht 71.5 in | Wt 189.4 lb

## 2012-11-12 DIAGNOSIS — K5090 Crohn's disease, unspecified, without complications: Secondary | ICD-10-CM

## 2012-11-12 DIAGNOSIS — K50919 Crohn's disease, unspecified, with unspecified complications: Secondary | ICD-10-CM

## 2012-11-12 NOTE — Patient Instructions (Addendum)
Decrease to 2 pills entocort (33m per day) starting today.   Call in 6 weeks to report on your symptoms, will decide on return visit at that point. Vitamin D level, Dexa Scan today. Continue getting flu vaccines yearly. Consider pneumoccal vaccination.

## 2012-11-12 NOTE — Progress Notes (Signed)
Review of gastrointestinal problems:  1. Crohn's disease. Presented with diarrhea, urgency, right lower quadrant discomfort. Sedimentation rate normal. Colonoscopy July 2011 found normal colon, terminal ileum was ulcerated, inflamed. Biopsies showed active, chronic ileitis. Small bowel follow-through July 2011 confirmed inflammation in terminal ileum, otherwise was normal. August, 2011 CT scan showed no clear bowel disease however there was some right lower quadrant Subcentimeter lymph nodes . Prometheus testing July 2011 was "not consistent with IBD," TPMT phenotype was normal. August, 2011 Entocort caused worse cramping, nausea. September, 2011 prednisone 40 mg a day greatly helped his symptoms however he could not taper past 25-30 mg a day. 04/2010 Evaluated by Antony Blackbird at [facility], was recommended to start immunomodulators but he declined, offered mesalamine and he agreed. Lost to follow up for about 2 years. 06/2012 represented off meds for 2 years, diarrhea, CBC, CMET, sed rate all normal, started pentasa with a very good response quickly. April, 2014: Response to mesalamine product has waned.  Colonoscopy 09/2012: moderate terminal ileitis only, normal colon; path confirmed chronic active ileitis. Was started on entocort.  IBD quality checklist:  Corticosteroid sparing therapy prescribed:  Bone loss assessment  Vitamin D 25-OH level (today):   Bone Density Assessment (today):   Influenza immunization (he does it annually)  Pneumococcal immunization (he is considering it)  Tobacco Screening and counciling (done every office visit)   HPI: This is a very pleasant 27 year old man whom I last saw time of colonoscopy. See those results summarized above  Started entocort 46m a day about a month ago.  He has felt great since then.  NO issues.     Past Medical History  Diagnosis Date  . 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 Irritable bowel syndrome   . Crohn's  disease     Past Surgical History  Procedure Laterality Date  . Tonsillectomy and adenoidectomy  1990's  . Colonoscopy  7/11    Dr. JArdis Hughs   Current Outpatient Prescriptions  Medication Sig Dispense Refill  . budesonide (ENTOCORT EC) 3 MG 24 hr capsule Take 3 capsules (9 mg total) by mouth daily.  90 capsule  6  . hyoscyamine (LEVSIN SL) 0.125 MG SL tablet 1-2 every 6 hours as needed  45 tablet  11  . mesalamine (LIALDA) 1.2 G EC tablet Take 4 tablets (4.8 g total) by mouth daily with breakfast.  120 tablet  11   No current facility-administered medications for this visit.    Allergies as of 11/12/2012 - Review Complete 11/12/2012  Allergen Reaction Noted  . Amoxicillin Rash 08/09/2007    Family History  Problem Relation Age of Onset  . Healthy Father   . Healthy Mother   . Crohn's disease Cousin   . Coronary artery disease Neg Hx   . Diabetes Neg Hx   . Hypertension      family hx  . Colon cancer Maternal Grandfather   . Colon polyps Maternal Grandfather     History   Social History  . Marital Status: Married    Spouse Name: N/A    Number of Children: 1  . Years of Education: N/A   Occupational History  . Youth MCompany secretary   Social History Main Topics  . Smoking status: Never Smoker   . Smokeless tobacco: Never Used  . Alcohol Use: No  . Drug Use: No  . Sexually Active: Not on file   Other Topics Concern  . Not on file  Social History Narrative   Married      1 son      Museum/gallery conservator; Psychologist, counselling            Physical Exam: BP 120/80  Pulse 80  Ht 5' 11.5" (1.816 m)  Wt 189 lb 6.4 oz (85.911 kg)  BMI 26.05 kg/m2 Constitutional: generally well-appearing Psychiatric: alert and oriented x3 Abdomen: soft, nontender, nondistended, no obvious ascites, no peritoneal signs, normal bowel sounds     Assessment and plan: 27 y.o. male with Crohn's ileitis  His symptoms are under very good control since he started Entocort 9 mg once  daily. He will back down to 6 mg starting today and we'll call to report on his symptoms in 6 weeks. I would probably taper him down even further if he is still doing so well. I'm going to get him up-to-date on bone, osteoporosis testing. He gets annual influenza shots vaccinations already. He will consider pneumococcus vaccination as well.

## 2013-02-03 ENCOUNTER — Telehealth: Payer: Self-pay | Admitting: Gastroenterology

## 2013-02-03 NOTE — Telephone Encounter (Signed)
Russell [name] should have called to report on his symptoms by now.  Patty, can you call to see how he is doing.

## 2013-02-03 NOTE — Telephone Encounter (Signed)
Should restart entocort at 1 pill per day, call to report on symptoms in 7-10 days.

## 2013-02-03 NOTE — Telephone Encounter (Signed)
Pt has been notified and will call in 7-10 days

## 2013-02-03 NOTE — Telephone Encounter (Signed)
Pt weaned off of entocort and has been having more generalized abd pain.  Has appt sch for 03/05/13 Offered appt for today but he has meetings all day.  I will forward to Dr Ardis Hughs for recommendations

## 2013-03-05 ENCOUNTER — Encounter: Payer: Self-pay | Admitting: Gastroenterology

## 2013-03-05 ENCOUNTER — Ambulatory Visit (INDEPENDENT_AMBULATORY_CARE_PROVIDER_SITE_OTHER): Payer: BC Managed Care – PPO | Admitting: Gastroenterology

## 2013-03-05 VITALS — BP 104/68 | HR 68 | Ht 71.5 in | Wt 190.5 lb

## 2013-03-05 DIAGNOSIS — K5090 Crohn's disease, unspecified, without complications: Secondary | ICD-10-CM

## 2013-03-05 NOTE — Patient Instructions (Addendum)
Please return to see Dr. Ardis Hughs in 6 months. Call with any troubles prior to then.

## 2013-03-05 NOTE — Progress Notes (Signed)
Review of gastrointestinal problems:  1. Crohn's disease. Presented with diarrhea, urgency, right lower quadrant discomfort. Sedimentation rate normal. Colonoscopy July 2011 found normal colon, terminal ileum was ulcerated, inflamed. Biopsies showed active, chronic ileitis. Small bowel follow-through July 2011 confirmed inflammation in terminal ileum, otherwise was normal. August, 2011 CT scan showed no clear bowel disease however there was some right lower quadrant Subcentimeter lymph nodes . Prometheus testing July 2011 was "not consistent with IBD," TPMT phenotype was normal. August, 2011 Entocort caused worse cramping, nausea. September, 2011 prednisone 40 mg a day greatly helped his symptoms however he could not taper past 25-30 mg a day. 04/2010 Evaluated by Russell [name] at [facility], was recommended to start immunomodulators but he declined, offered mesalamine and he agreed. Lost to follow up for about 2 years. 06/2012 represented off meds for 2 years, diarrhea, CBC, CMET, sed rate all normal, started pentasa with a very good response quickly. April, 2014: Response to mesalamine product has waned. Colonoscopy 09/2012: moderate terminal ileitis only, normal colon; path confirmed chronic active ileitis. Was started on entocort. 02/2013, entocort helped, when he weaned off his symptoms returned briefly but then improved again without even restarting Entocort.    HPI: This is a very pleasant 27 year old man whom I last saw about 3 months ago.  When he weaned down to zero and symptoms returned (loose stools, some minor pains).  Lasted about 2 week. His bowels are GREAT, no urgency.  Formed stools, twice daily.     Past Medical History  Diagnosis Date  . 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 Irritable bowel syndrome   . Crohn's disease     Past Surgical History  Procedure Laterality Date  . Tonsillectomy and adenoidectomy  1990's  . Colonoscopy  7/11    Dr. Ardis [name]     Current Outpatient Prescriptions  Medication Sig Dispense Refill  . pantoprazole (PROTONIX) 20 MG tablet Take 20 mg by mouth 2 (two) times daily.        No current facility-administered medications for this visit.    Allergies as of 03/05/2013 - Review Complete 03/05/2013  Allergen Reaction Noted  . Amoxicillin Rash 08/09/2007    Family History  Problem Relation Age of Onset  . Healthy Father   . Healthy Mother   . Crohn's disease Cousin   . Coronary artery disease Neg Hx   . Diabetes Neg Hx   . Hypertension      family hx  . Colon cancer Maternal Grandfather   . Colon polyps Maternal Grandfather     History   Social History  . Marital Status: Married    Spouse Name: N/A    Number of Children: 1  . Years of Education: N/A   Occupational History  . 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 Smoking status: Never Smoker   . Smokeless tobacco: Never Used  . Alcohol Use: No  . Drug Use: No  . Sexual Activity: Not on file   Other Topics Concern  . Not on file   Social History Narrative   Married      1 son      Museum/gallery conservator; Psychologist, counselling            Physical Exam: BP 104/68  Pulse 68  Ht 5' 11.5" (1.816 m)  Wt 190 lb 8 oz (86.41 kg)  BMI 26.2 kg/m2 Constitutional: generally well-appearing Psychiatric: alert and oriented x3 Abdomen: soft, nontender, nondistended,  no obvious ascites, no peritoneal signs, normal bowel sounds     Assessment and plan: 28 y.o. male with Crohn's ileitis  He responds well to steroids. Even Entocort. Currently he is off all medicines. He is feeling absolutely fine without any Crohn's symptoms. He will return to see me in 6 months, sooner if needed. He has a flare we will restarted Entocort.

## 2013-06-23 ENCOUNTER — Telehealth: Payer: Self-pay | Admitting: Gastroenterology

## 2013-06-24 MED ORDER — ESOMEPRAZOLE MAGNESIUM 40 MG PO CPDR: 40.0000 mg | DELAYED_RELEASE_CAPSULE | Freq: Two times a day (BID) | ORAL | Status: DC

## 2013-06-24 NOTE — Telephone Encounter (Signed)
Left message for patient to call back  

## 2013-06-24 NOTE — Telephone Encounter (Signed)
Patient states that at his last office visit, his reflux was doing so well that Dr Ardis Hughs told him he could just try OTC PPI's. However, since discontinue Nexium and starting OTC PPI's, he has become hoarse and his throat is sore. He would like to start back on BID Nexium as before when he was doing well. He will contact us if not feeling better on the Nexium in 1-2 weeks. Rx sent to [facility].

## 2013-06-25 ENCOUNTER — Telehealth: Payer: Self-pay | Admitting: Gastroenterology

## 2013-06-25 MED ORDER — PANTOPRAZOLE SODIUM 40 MG PO TBEC: 40.0000 mg | DELAYED_RELEASE_TABLET | Freq: Two times a day (BID) | ORAL | Status: DC

## 2013-06-25 MED ORDER — PROMETHAZINE HCL 25 MG PO TABS: 25.0000 mg | ORAL_TABLET | Freq: Four times a day (QID) | ORAL | Status: DC | PRN

## 2013-06-25 NOTE — Telephone Encounter (Signed)
Dr Ardis Hughs can we [place] the [place]?

## 2013-06-25 NOTE — Telephone Encounter (Signed)
OK.  Prefer he take imodium instead of lomotil (OTC) Please call in phenergan 81m pill, take one pill every 6 hours as needed for nausea, vomiting, disp 30 with 1 refill.

## 2013-06-25 NOTE — Telephone Encounter (Signed)
Prescription sent pt aware

## 2013-06-25 NOTE — Telephone Encounter (Signed)
Patient states that his insurance actually prefers protonix, prilosec, aciphex or dexilant. We wil try protonix and he will call back if this is not effective.

## 2013-06-25 NOTE — Addendum Note (Signed)
Addended by: Larina Bras on: 06/25/2013 02:59 PM   Modules accepted: Orders, Medications

## 2013-06-25 NOTE — Telephone Encounter (Signed)
I suspect he is talking about entocort.  That is ok.  Entocort 52m pill, take 3 pills once daily, disp90 with 2 refills.

## 2013-06-25 NOTE — Telephone Encounter (Signed)
Russell [name] says he wants lomotil and phenergan in case he gets some sort of stomach issues while he is there.  He says the guide told him that because of his crohn's he may need to take this along just in case

## 2013-07-17 ENCOUNTER — Ambulatory Visit (INDEPENDENT_AMBULATORY_CARE_PROVIDER_SITE_OTHER): Payer: 59 | Admitting: Family Medicine

## 2013-07-17 ENCOUNTER — Encounter: Payer: Self-pay | Admitting: Family Medicine

## 2013-07-17 VITALS — BP 110/80 | HR 116 | Temp 99.9°F | Ht 71.5 in | Wt 187.0 lb

## 2013-07-17 DIAGNOSIS — J209 Acute bronchitis, unspecified: Secondary | ICD-10-CM

## 2013-07-17 MED ORDER — HYDROCODONE-HOMATROPINE 5-1.5 MG/5ML PO SYRP: 5.0000 mL | ORAL_SOLUTION | ORAL | Status: DC | PRN

## 2013-07-17 MED ORDER — AZITHROMYCIN 250 MG PO TABS: ORAL_TABLET | ORAL | Status: DC

## 2013-07-17 NOTE — Progress Notes (Signed)
Pre visit review using our clinic review tool, if applicable. No additional management support is needed unless otherwise documented below in the visit note. 

## 2013-07-17 NOTE — Progress Notes (Signed)
   Subjective:    Patient ID: Russell [name], male    DOB: 1985/06/19, 28 y.o.   MRN: [id number]  HPI Here for 3 days of fever to 100 degrees, HA, body aches, and coughing up green sputum. On Tylenol and fluids.   Review of Systems  Constitutional: Positive for fever.  HENT: Positive for congestion and postnasal drip.   Eyes: Negative.   Respiratory: Positive for cough.        Objective:   Physical Exam  Constitutional: He appears well-developed and well-nourished.  HENT:  Right Ear: External ear normal.  Left Ear: External ear normal.  Nose: Nose normal.  Mouth/Throat: Oropharynx is clear and moist.  Eyes: Conjunctivae are normal.  Pulmonary/Chest: Effort normal. No respiratory distress. He has no wheezes. He has no rales.  Scattered rhonchi   Lymphadenopathy:    He has no cervical adenopathy.          Assessment & Plan:  Add Mucinex

## 2013-07-24 ENCOUNTER — Telehealth: Payer: Self-pay | Admitting: Family Medicine

## 2013-07-24 NOTE — Telephone Encounter (Signed)
Patient Information:  Caller Name: Aidon  Phone: [phone]  Patient: Russell [name], Russell [name]  Gender: Male  DOB: 09/28/1985  Age: 28 Years  PCP: [facility])  Office Follow Up:  Does the office need to follow up with this patient?: No  Instructions For The Office: N/A   Symptoms  Reason For Call & Symptoms: Pt states he was seen in the office and diagnosed with Bronchitis and completed the ZPak on 07/22/13.  Pt reports he will be leaving to go out of the country 07/26/13.  Pt reports he is not improved continues to feel tightness in the chest.  Reviewed Health History In EMR: Yes  Reviewed Medications In EMR: Yes  Reviewed Allergies In EMR: Yes  Reviewed Surgeries / Procedures: Yes  Date of Onset of Symptoms: 07/17/2013  Guideline(s) Used:  Cough  Disposition Per Guideline:   See Today or Tomorrow in Office  Reason For Disposition Reached:   Patient wants to be seen  Advice Given:  Call Back If:  You become worse.  Patient Will Follow Care Advice:  YES  Pt will call in the AM when office opens to schedule same day appt.

## 2013-07-24 NOTE — Telephone Encounter (Signed)
Left message on cell phone that I have him scheduled to see Dr. Diona Browner tomorrow 07/25/2013 @ 11:45am.

## 2013-07-24 NOTE — Telephone Encounter (Signed)
Agree pt needs to be seen.. Can work him in to 15 min slot on Friday.

## 2013-07-25 ENCOUNTER — Ambulatory Visit: Payer: 59 | Admitting: Family Medicine

## 2013-09-03 ENCOUNTER — Encounter: Payer: Self-pay | Admitting: Family Medicine

## 2013-09-03 ENCOUNTER — Ambulatory Visit (INDEPENDENT_AMBULATORY_CARE_PROVIDER_SITE_OTHER): Payer: 59 | Admitting: Family Medicine

## 2013-09-03 VITALS — BP 130/80 | HR 72 | Wt 186.0 lb

## 2013-09-03 DIAGNOSIS — R059 Cough, unspecified: Secondary | ICD-10-CM

## 2013-09-03 DIAGNOSIS — R05 Cough: Secondary | ICD-10-CM

## 2013-09-03 MED ORDER — BENZONATATE 200 MG PO CAPS: 200.0000 mg | ORAL_CAPSULE | Freq: Three times a day (TID) | ORAL | Status: DC | PRN

## 2013-09-03 MED ORDER — AZITHROMYCIN 500 MG PO TABS: 500.0000 mg | ORAL_TABLET | Freq: Every day | ORAL | Status: DC

## 2013-09-03 NOTE — Progress Notes (Signed)
SUBJECTIVE:  Russell [name] is a 28 y.o. male who complains of dry cough for 28 days. Patient was seen previously by Dr. Sarajane Jews who was diagnosed with an acute bronchitis one month ago. Patient states that the cuff to get better when he is on the medications but unfortunately continued right when he was done with medications. Patient states that unfortunately still seems to be around day and night with no association with food or times a day. Patient states it is nonproductive but feels that there is something in the back of his throat.  He denies a history of chest pain, dizziness, fatigue, shortness of breath, vomiting, weakness and weight loss and denies a history of asthma. Patient denies smoke cigarettes.   OBJECTIVE: Blood pressure 130/80, pulse 72, weight 186 lb (84.369 kg), SpO2 99.00%.  He appears well, vital signs are as noted. Ears normal.  Throat and pharynx normal with mild postnasal drip.  Neck supple. Does have what appears to be a small lymph node on the right anterior cervical lymph chain. Nose is mildly congested. Sinuses non tender. The chest is clear, without wheezes or rales.  ASSESSMENT:  Postviral cough PLAN: Differential also includes gastroesophageal reflux disease the patient is on Protonix twice daily. I do think there is a possibility for continued atypical pneumonia in the patient's lungs are clear at this time. Patient was given another dose of azithromycin if necessary. He is to wait 72 hours until he starts his medication. Patient was given Ladona Ridgel we discussed over-the-counter antihistamines it could be beneficial. Patient try these interventions and continual come back in 2 weeks.

## 2013-09-03 NOTE — Patient Instructions (Signed)
Goodto meet you Try tessalon perles 2 times daily for cough If not better by weekend try  Azithromycin 1 pill daily for 3 days If not better in 2 weeks be seen again.

## 2013-10-21 ENCOUNTER — Telehealth: Payer: Self-pay | Admitting: Family Medicine

## 2013-10-21 NOTE — Telephone Encounter (Signed)
Patient wants a referral to ENT for hoarseness. He has [facility] insurance that now needs a formal referral online from our office. 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 Pls place referral.

## 2013-10-22 ENCOUNTER — Other Ambulatory Visit: Payer: Self-pay | Admitting: Family Medicine

## 2013-10-22 DIAGNOSIS — R490 Dysphonia: Secondary | ICD-10-CM

## 2013-10-22 NOTE — Telephone Encounter (Signed)
done

## 2013-10-27 ENCOUNTER — Encounter: Payer: Self-pay | Admitting: Family Medicine

## 2013-10-27 ENCOUNTER — Ambulatory Visit (INDEPENDENT_AMBULATORY_CARE_PROVIDER_SITE_OTHER): Payer: 59 | Admitting: Family Medicine

## 2013-10-27 VITALS — BP 110/84 | HR 73 | Temp 98.0°F | Wt 190.2 lb

## 2013-10-27 DIAGNOSIS — J029 Acute pharyngitis, unspecified: Secondary | ICD-10-CM

## 2013-10-27 DIAGNOSIS — J069 Acute upper respiratory infection, unspecified: Secondary | ICD-10-CM

## 2013-10-27 LAB — POCT RAPID STREP A (OFFICE): Rapid Strep A Screen: NEGATIVE

## 2013-10-27 MED ORDER — FLUTICASONE PROPIONATE 50 MCG/ACT NA SUSP: 2.0000 | Freq: Every day | NASAL | Status: DC

## 2013-10-27 MED ORDER — ALBUTEROL SULFATE HFA 108 (90 BASE) MCG/ACT IN AERS: 2.0000 | INHALATION_SPRAY | Freq: Four times a day (QID) | RESPIRATORY_TRACT | Status: DC | PRN

## 2013-10-27 NOTE — Assessment & Plan Note (Signed)
Likely with an ongoing post infectious cough.  D/w pt.  SABA may help some with the preexisting cough.   Acute issue- RST neg, likely viral.  Nontoxic. D/w pt.  flonase for sinus congestion.  See instructions.

## 2013-10-27 NOTE — Progress Notes (Signed)
Pre visit review using our clinic review tool, if applicable. No additional management support is needed unless otherwise documented below in the visit note.  Had been sick since 2/15 with a dry nagging cough and wheeze.    More recent sx started about 4 days ago.  Voice is hoarse. Post nasal gtt.  No fevers.  Inc in cough.  Aches, possibly from working in the yard.  No vomiting, diarrhea at baseline likely from crohn's.  Rhinorrhea, stuffy.  Ear pressure.  Max and frontal sinus pain B.  No tooth pain.    Meds, vitals, and allergies reviewed.   ROS: See HPI.  Otherwise, noncontributory.  GEN: nad, alert and oriented HEENT: mucous membranes moist, tm w/o erythema, nasal exam w/o erythema, clear discharge noted,  OP with cobblestoning, hoarse voice.  No exudates.  Sinuses ttp x4 NECK: supple w/o LA CV: rrr.   PULM: ctab, no inc wob EXT: no edema SKIN: no acute rash B ETD noted.

## 2013-10-27 NOTE — Patient Instructions (Signed)
Drink plenty of fluids, take tylenol as needed, and gargle with warm salt water for your throat.  Use the flonase and warm compresses for your facial pain.  Use the inhaler for the cough.  This should gradually improve.  Take care.  Let us know if you have other concerns.

## 2013-12-02 ENCOUNTER — Encounter: Payer: Self-pay | Admitting: Family Medicine

## 2013-12-02 ENCOUNTER — Ambulatory Visit (INDEPENDENT_AMBULATORY_CARE_PROVIDER_SITE_OTHER): Payer: 59 | Admitting: Family Medicine

## 2013-12-02 VITALS — Ht 71.5 in | Wt 189.2 lb

## 2013-12-02 DIAGNOSIS — K5000 Crohn's disease of small intestine without complications: Secondary | ICD-10-CM

## 2013-12-02 DIAGNOSIS — K5090 Crohn's disease, unspecified, without complications: Secondary | ICD-10-CM

## 2013-12-02 NOTE — Patient Instructions (Addendum)
-   Physical activity: At least 30 minutes 3 X wk.    - When you feel hungry, EAT.    - Snacks:  Protein bars (experiment), Protein drinks (SlimFast, Atkins Advantage, Boost/Ensure/[employer] generic, Orgain ([employer]), yogurt.    - What to look for in a protein bar:  Protein (8-15 g); no more than 16 grams of sugar; ingredients you understand; Clif Bars, Balance Bars.   - Eat at least 3 meals and 1-2 snacks per day.  Aim for no more than 5 hours between eating.  Eat breakfast within the first hour of getting up.  - Each meal should include at least the following:  Protein  Starch  Veg's and/or fruit (if Crohn's is well controlled)  - Anti-inflammatory diet (Google this term):  - Low in sugar and low in low-glycemic carb's (carb's that don't spike your blood sugar)  - High in plant foods  - Balanced protein (some with each meal)  - This means mostly whole, real foods vs. highly processed foods.    - Example of lower glycemic foods: HIGH   LOW White potatoes Sweet potatoes Refined grains  Whole grains  - I recommend a multi-vitamin mineral supplement (1 a day) for assurance of meeting nutrient needs.    - Pediatric Nutrition:  See BloggingAssistance.si; check out her book:  Child of Mine; Feeding with Love and Good Sense.    - Qs:  Email [email or web address].

## 2013-12-02 NOTE — Progress Notes (Signed)
Medical Nutrition Therapy:  Appt start time: 1430 end time:  1586.  Assessment:  Primary concerns today: Dietary mgmt for Crohn's disease.  Russell [name] came today with his wife Russell [name].  He was diagnosed with Crohn's dis a few years ago, but has had GI problems most of his life.  His Crohn's is pretty well controlled at present; no med's.  He seldom varies his diet, however, having determined a few foods that seem to not aggravate.  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  Russell [name] sometimes gets very hungry, but resists snacking.    Usual eating pattern includes 3 meals and 0-1 snacks per day.   Usual physical activity includes none.  Frequent foods include coffee w/ creamer, chx, [place] food (minus tomato/spicy sauce), starches (rice, potatoes, pasta), and daily bkfst of Slimfast in 2% milk.  Avoided foods include fish, seafood, raw veg's, some raw fruits, tomato-based sauces, spicy foods, fried and fatty foods, all of which cause GI discomfort.    24-hr recall suggests an in take of ~2660 kcal: (Up at 5:30 AM) B (6 AM)-   2 c coffee, 2 tsp creamer, 1 Slimfast in 2% milk      350 Snk ( AM)-    L (12 PM)-  1 Chick-fil[employer] sandwich (440), regular ff's, 16 oz sweet tea  1100 Snk ( PM)-   D 9( PM)-  8 [employer]-fil[employer] nuggets (440), regular ff's, 16 oz lemonade  1210 Snk ( PM)-   Yesterday's dinner was atypical.  More normal dinner is meat (chx), starch, veg's.    Progress Towards Goal(s):  In progress.   Nutritional Diagnosis:  NB-1.1 Food and nutrition-related knowledge deficit As related to Crohn's disease.  As evidenced by patient's expressed interest in learning how to optimize his nutritional intake.    Intervention:  Nutrition education.  Monitoring/Evaluation:  Dietary intake, exercise, and body weight prn.

## 2014-02-10 ENCOUNTER — Encounter: Payer: Self-pay | Admitting: Gastroenterology

## 2014-02-25 ENCOUNTER — Telehealth: Payer: Self-pay | Admitting: Gastroenterology

## 2014-02-25 MED ORDER — BUDESONIDE 3 MG PO CP24
9.0000 mg | ORAL_CAPSULE | Freq: Every day | ORAL | Status: DC
Start: 2014-02-25 — End: 2014-04-22

## 2014-02-25 NOTE — Telephone Encounter (Signed)
Diarrhea every 1-2 hours, no blood or mucous, lower abd cramping, right side sharp abd pain, can't eat causes discomfort, nausea please advise

## 2014-02-25 NOTE — Telephone Encounter (Signed)
The pt is aware and meds have been sent to the pharmacy follow up has been scheduled

## 2014-02-25 NOTE — Telephone Encounter (Signed)
i last saw him about a year ago.  He has been responsive to steroids, even entocort in the past. Would like him to restart entocort 30m, once daily, prescribe 1 month with 2 refills.  rov with me (double book if needed) in 10-14 days to check his response. He should call sooner if not improving or if he worsens.

## 2014-03-10 ENCOUNTER — Ambulatory Visit (INDEPENDENT_AMBULATORY_CARE_PROVIDER_SITE_OTHER): Payer: 59 | Admitting: Gastroenterology

## 2014-03-10 ENCOUNTER — Encounter: Payer: Self-pay | Admitting: Gastroenterology

## 2014-03-10 ENCOUNTER — Ambulatory Visit: Payer: 59 | Admitting: Gastroenterology

## 2014-03-10 VITALS — BP 118/78 | HR 68 | Ht 72.0 in | Wt 189.0 lb

## 2014-03-10 DIAGNOSIS — K50119 Crohn's disease of large intestine with unspecified complications: Secondary | ICD-10-CM

## 2014-03-10 DIAGNOSIS — K5010 Crohn's disease of large intestine without complications: Secondary | ICD-10-CM

## 2014-03-10 MED ORDER — HYOSCYAMINE SULFATE ER 0.375 MG PO TB12: 0.3750 mg | ORAL_TABLET | Freq: Two times a day (BID) | ORAL | Status: DC

## 2014-03-10 MED ORDER — MESALAMINE 1.2 G PO TBEC: 4.8000 g | DELAYED_RELEASE_TABLET | Freq: Every day | ORAL | Status: DC

## 2014-03-10 NOTE — Patient Instructions (Addendum)
Wean your entocort as discussed with Dr Ardis Hughs. We have sent your Lialda 1.2g 4 pills once daily. Start Imodium twice daily everyday. Start Levbid 0.375 mg twice daily, this has been sent to your pharmacy. Please have stool studies done, C Diff PCR, Culture, Fecal Leuk., Ova and Parasite. Call in 10 days to report on your symptoms. Follow up in 6-8 weeks. CC:  Russell [name]

## 2014-03-10 NOTE — Progress Notes (Signed)
Review of gastrointestinal problems:  1. Crohn's disease. Presented with diarrhea, urgency, right lower quadrant discomfort. Sedimentation rate normal. Colonoscopy July 2011 found normal colon, terminal ileum was ulcerated, inflamed. Biopsies showed active, chronic ileitis. Small bowel follow-through July 2011 confirmed inflammation in terminal ileum, otherwise was normal. August, 2011 CT scan showed no clear bowel disease however there was some right lower quadrant Subcentimeter lymph nodes . Prometheus testing July 2011 was "not consistent with IBD," TPMT phenotype was normal. August, 2011 Entocort caused worse cramping, nausea. September, 2011 prednisone 40 mg a day greatly helped his symptoms however he could not taper past 25-30 mg a day. 04/2010 Evaluated by Antony Blackbird at [facility], was recommended to start immunomodulators but he declined, offered mesalamine and he agreed. Lost to follow up for about 2 years. 06/2012 represented off meds for 2 years, diarrhea, CBC, CMET, sed rate all normal, started pentasa with a very good response quickly. April, 2014: Response to mesalamine product has waned. Colonoscopy 09/2012: moderate terminal ileitis only, normal colon; path confirmed chronic active ileitis. Was started on entocort. 02/2013, entocort helped, when he weaned off his symptoms returned briefly but then improved again without even restarting Entocort.    HPI: This is a 28 year old man whom I last saw about a year ago. 2-3 weeks ago he began to have a change in his bowel habits and abdominal cramping. Specifically he would normally have 2 soft BMs a day while on a single Imodium every day. After the change she noticed 4-5 months looser stools, significant lower abdominal cramping. He has had no antibiotic exposure in about a year or so. No sick contacts. I initially felt that this might be due to his Crohn's disease and restart him on budesonide. He has been taking the budesonide 2 pills once daily since  then but immediately after starting the budesonide he began to have significant headaches. Because of headaches he has been taking NSAIDs every day or 2. Tylenol does not help for his headaches.    Past Medical History  Diagnosis Date  . 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 Irritable bowel syndrome   . Crohn's disease     Past Surgical History  Procedure Laterality Date  . Tonsillectomy and adenoidectomy  1990's  . Colonoscopy  7/11    Dr. Ardis Hughs    Current Outpatient Prescriptions  Medication Sig Dispense Refill  . albuterol (PROVENTIL HFA;VENTOLIN HFA) 108 (90 BASE) MCG/ACT inhaler Inhale 2 puffs into the lungs every 6 (six) hours as needed for wheezing or shortness of breath.  1 Inhaler  0  . budesonide (ENTOCORT EC) 3 MG 24 hr capsule Take 3 capsules (9 mg total) by mouth daily.  90 capsule  2  . fluticasone (FLONASE) 50 MCG/ACT nasal spray Place 2 sprays into both nostrils daily.      . pantoprazole (PROTONIX) 20 MG tablet Take 20 mg by mouth 2 (two) times daily.       No current facility-administered medications for this visit.    Allergies as of 03/10/2014 - Review Complete 10/27/2013  Allergen Reaction Noted  . Amoxicillin Rash 08/09/2007    Family History  Problem Relation Age of Onset  . Healthy Father   . Healthy Mother   . Crohn's disease Cousin   . Coronary artery disease Neg Hx   . Diabetes Neg Hx   . Hypertension      family hx  . Colon cancer Maternal Grandfather   . Colon  polyps Maternal Grandfather     History   Social History  . Marital Status: Married    Spouse Name: N/A    Number of Children: 1  . Years of Education: N/A   Occupational History  . 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 Smoking status: Never Smoker   . Smokeless tobacco: Never Used  . Alcohol Use: No  . Drug Use: No  . Sexual Activity: Not on file   Other Topics Concern  . Not on file   Social History Narrative   Married       1 son      Youth minister; [employer]            Physical Exam: There were no vitals taken for this visit. Constitutional: generally well-appearing Psychiatric: alert and oriented x3 Abdomen: soft, nontender, nondistended, no obvious ascites, no peritoneal signs, normal bowel sounds     Assessment and plan: 28 y.o. male with history of ileal Crohn's disease, recent change in bowel habits, abdominal cramping  I think this is probably Crohn's flare however he is going to get stool testing done for C. difficile, routine culture, ova parasites to be safe. The Entocort may indeed be causing some headaches and he will wean off that over the next week or so. He is going to restart mesalamine at full dose 4.8 g daily. He will start scheduled antispasmodic medicine 0.375 Levbid twice daily. He will increase his Imodium twice daily scheduled. He will call to report if symptoms in 10 days and he'll return to see me in 6-8 weeks and sooner if needed.  Of note our computer system was partially unavailable during his visits to some of the documentation above was entered after his visit

## 2014-03-19 ENCOUNTER — Telehealth: Payer: Self-pay | Admitting: Gastroenterology

## 2014-03-20 NOTE — Telephone Encounter (Signed)
I have left message for the patient to call back

## 2014-03-24 NOTE — Telephone Encounter (Signed)
He should continue with previous suggestions.  I noticed he did not drop off stool sample yet, can you remind him about those.

## 2014-03-24 NOTE — Telephone Encounter (Signed)
Left message on machine to call back  

## 2014-03-25 NOTE — Telephone Encounter (Signed)
Left message on machine to call back  

## 2014-03-26 NOTE — Telephone Encounter (Signed)
Left message on machine to call back  

## 2014-03-31 NOTE — Telephone Encounter (Signed)
Pt aware and will come in and pick up stool kit and turn in asap

## 2014-04-22 ENCOUNTER — Ambulatory Visit (INDEPENDENT_AMBULATORY_CARE_PROVIDER_SITE_OTHER): Payer: 59 | Admitting: Family Medicine

## 2014-04-22 ENCOUNTER — Encounter: Payer: Self-pay | Admitting: Family Medicine

## 2014-04-22 VITALS — BP 110/70 | HR 69 | Temp 98.4°F | Ht 72.0 in | Wt 189.8 lb

## 2014-04-22 DIAGNOSIS — J029 Acute pharyngitis, unspecified: Secondary | ICD-10-CM

## 2014-04-22 DIAGNOSIS — J040 Acute laryngitis: Secondary | ICD-10-CM

## 2014-04-22 DIAGNOSIS — B349 Viral infection, unspecified: Secondary | ICD-10-CM

## 2014-04-22 LAB — POCT RAPID STREP A (OFFICE): Rapid Strep A Screen: NEGATIVE

## 2014-04-22 MED ORDER — HYDROCODONE-HOMATROPINE 5-1.5 MG/5ML PO SYRP: ORAL_SOLUTION | ORAL | Status: DC

## 2014-04-22 MED ORDER — PREDNISONE 20 MG PO TABS: ORAL_TABLET | ORAL | Status: DC

## 2014-04-22 MED ORDER — MAGIC MOUTHWASH W/LIDOCAINE: 5.0000 mL | Freq: Four times a day (QID) | ORAL | Status: DC | PRN

## 2014-04-22 NOTE — Progress Notes (Signed)
Pre visit review using our clinic review tool, if applicable. No additional management support is needed unless otherwise documented below in the visit note. 

## 2014-04-22 NOTE — Progress Notes (Signed)
Dr. Frederico Hamman T. Katsumi Wisler, MD, [facility] Primary Care and Sports Medicine [place], [address] Phone: [phone] Fax: [phone]  04/22/2014  Patient: Russell [name], MRN: [id number], DOB: 09/08/1985, 28 y.o.  Primary Physician:  Owens Loffler, MD  Chief Complaint: Sore Throat and Hoarse  Subjective:   Russell [name] is a 28 y.o. very pleasant male patient who presents with the following: Starting o n Sunday, throat was sore and sinus drainage. Coughing last night. y little b Lost voice.   Starting on Sunday, this patient's progress quite sore and he started to get a lot of sinus drainage. He has been coughing a lot last night. He also started to lose his voice, and he is generally been feeling poorly overall.  Past Medical History, Surgical History, Social History, Family History, Problem List, Medications, and Allergies have been reviewed and updated if relevant.  ROS: GEN: Acute illness details above GI: Tolerating PO intake GU: maintaining adequate hydration and urination Pulm: No SOB Interactive and getting along well at home.  Otherwise, ROS is as per the HPI.   Objective:   BP 110/70 mmHg  Pulse 69  Temp(Src) 98.4 F (36.9 C) (Oral)  Ht 6' (1.829 m)  Wt 189 lb 12 oz (86.07 kg)  BMI 25.73 kg/m2   Gen: WDWN, NAD; A & O x3, cooperative. Pleasant.Globally Non-toxic HEENT: Normocephalic and atraumatic. Throat clear, w/o exudate, R TM clear, L TM - good landmarks, No fluid present. rhinnorhea.  MMM Frontal sinuses: NT Max sinuses: NT NECK: Anterior cervical  LAD is absent CV: RRR, No M/G/R, cap refill <2 sec PULM: Breathing comfortably in no respiratory distress. no wheezing, crackles, rhonchi EXT: No c/c/e PSYCH: Friendly, good eye contact MSK: Nml gait     Laboratory and Imaging Data: Results for orders placed or performed in visit on 04/22/14  POCT rapid strep A  Result Value Ref Range   Rapid Strep A Screen Negative  Negative     Assessment and Plan:   Viral syndrome  Sore throat - Plan: POCT rapid strep A  Laryngitis  Reviewed supportive care, rest. 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  Follow-up: No Follow-up on file.  New Prescriptions   ALUM & MAG HYDROXIDE-SIMETH (MAGIC MOUTHWASH W/LIDOCAINE) SOLN    Take 5 mLs by mouth 4 (four) times daily as needed for mouth pain. Mix: 80 ml maalox, 80 ml benadryl susp, 80 mL lidocaine 1%   1 tsp po gargle q 4 hours prn sore throat  #240 mL total   HYDROCODONE-HOMATROPINE (HYCODAN) 5-1.5 MG/5ML SYRUP    1 tsp po at night before bed prn cough   PREDNISONE (DELTASONE) 20 MG TABLET    2 tabs po for 4 days, then 1 tab po for 3 days   Orders Placed This Encounter  Procedures  . POCT rapid strep A    Signed,  Guido Comp T. Miaisabella Bacorn, MD   Patient's Medications  New Prescriptions   ALUM & MAG HYDROXIDE-SIMETH (MAGIC MOUTHWASH W/LIDOCAINE) SOLN    Take 5 mLs by mouth 4 (four) times daily as needed for mouth pain. Mix: 80 ml maalox, 80 ml benadryl susp, 80 mL lidocaine 1%   1 tsp po gargle q 4 hours prn sore throat  #240 mL total   HYDROCODONE-HOMATROPINE (HYCODAN) 5-1.5 MG/5ML SYRUP    1  tsp po at night before bed prn cough   PREDNISONE (DELTASONE) 20 MG TABLET    2 tabs po for 4 days, then 1 tab po for 3 days  Previous Medications   HYOSCYAMINE (LEVBID) 0.375 MG 12 HR TABLET    Take 1 tablet (0.375 mg total) by mouth 2 (two) times daily.  Modified Medications   No medications on file  Discontinued Medications   ALBUTEROL (PROVENTIL HFA;VENTOLIN HFA) 108 (90 BASE) MCG/ACT INHALER    Inhale 2 puffs into the lungs every 6 (six) hours as needed for wheezing or shortness of breath.   BUDESONIDE (ENTOCORT EC) 3 MG 24 HR CAPSULE    Take 3 capsules (9 mg total) by mouth daily.   FLUTICASONE (FLONASE) 50 MCG/ACT NASAL SPRAY     Place 2 sprays into both nostrils daily.   MESALAMINE (LIALDA) 1.2 G EC TABLET    Take 4 tablets (4.8 g total) by mouth daily with breakfast.   PANTOPRAZOLE (PROTONIX) 20 MG TABLET    Take 20 mg by mouth 2 (two) times daily.

## 2014-05-13 ENCOUNTER — Telehealth: Payer: Self-pay | Admitting: Gastroenterology

## 2014-05-13 NOTE — Telephone Encounter (Signed)
Pt states Saturday am he had quite a bit of BRB in his stool, this stopped by Sat pm and he has not had any since. Pt states that about 2 hours ago he got very nauseated and vomited, states he is not feeling well at all. Pt states he is prescribed asacol but he was feeling so well he stopped taking it about 3 weeks ago. Pt thinks he may be having a crohn's flare. Please advise.

## 2014-05-14 ENCOUNTER — Other Ambulatory Visit (INDEPENDENT_AMBULATORY_CARE_PROVIDER_SITE_OTHER): Payer: 59

## 2014-05-14 ENCOUNTER — Other Ambulatory Visit: Payer: Self-pay

## 2014-05-14 DIAGNOSIS — K50911 Crohn's disease, unspecified, with rectal bleeding: Secondary | ICD-10-CM

## 2014-05-14 LAB — CBC WITH DIFFERENTIAL/PLATELET
BASOS ABS: 0.1 10*3/uL (ref 0.0–0.1)
Basophils Relative: 0.9 % (ref 0.0–3.0)
EOS ABS: 0.1 10*3/uL (ref 0.0–0.7)
Eosinophils Relative: 1.2 % (ref 0.0–5.0)
HCT: 43.8 % (ref 39.0–52.0)
Hemoglobin: 14.7 g/dL (ref 13.0–17.0)
LYMPHS PCT: 29.1 % (ref 12.0–46.0)
Lymphs Abs: 2.3 10*3/uL (ref 0.7–4.0)
MCHC: 33.6 g/dL (ref 30.0–36.0)
MCV: 86.2 fl (ref 78.0–100.0)
Monocytes Absolute: 0.6 10*3/uL (ref 0.1–1.0)
Monocytes Relative: 7.6 % (ref 3.0–12.0)
NEUTROS PCT: 61.2 % (ref 43.0–77.0)
Neutro Abs: 4.8 10*3/uL (ref 1.4–7.7)
PLATELETS: 278 10*3/uL (ref 150.0–400.0)
RBC: 5.08 Mil/uL (ref 4.22–5.81)
RDW: 12.8 % (ref 11.5–15.5)
WBC: 7.8 10*3/uL (ref 4.0–10.5)

## 2014-05-14 LAB — COMPREHENSIVE METABOLIC PANEL
ALBUMIN: 4.6 g/dL (ref 3.5–5.2)
ALT: 16 U/L (ref 0–53)
AST: 16 U/L (ref 0–37)
Alkaline Phosphatase: 37 U/L — ABNORMAL LOW (ref 39–117)
BUN: 11 mg/dL (ref 6–23)
CHLORIDE: 103 meq/L (ref 96–112)
CO2: 28 meq/L (ref 19–32)
Calcium: 9.2 mg/dL (ref 8.4–10.5)
Creatinine, Ser: 1 mg/dL (ref 0.4–1.5)
GFR: 91.11 mL/min (ref >60.00)
Glucose, Bld: 109 mg/dL — ABNORMAL HIGH (ref 70–99)
POTASSIUM: 3.7 meq/L (ref 3.5–5.1)
SODIUM: 141 meq/L (ref 135–145)
Total Bilirubin: 0.9 mg/dL (ref 0.2–1.2)
Total Protein: 7 g/dL (ref 6.0–8.3)

## 2014-05-14 NOTE — Telephone Encounter (Signed)
Left message for pt to call back.  Pt aware and will come for labs today. Pt scheduled to see Dr. Ardis Hughs 05/15/14@10 :45am. Pt aware of appt.

## 2014-05-14 NOTE — Telephone Encounter (Signed)
He needs labs today (cbc, cmet) and rov with me tomorrow (Can double book in AM tomorrow).  If he can't wait, then appt with extender today if available. Thanks

## 2014-05-15 ENCOUNTER — Encounter: Payer: Self-pay | Admitting: Gastroenterology

## 2014-05-15 ENCOUNTER — Ambulatory Visit (INDEPENDENT_AMBULATORY_CARE_PROVIDER_SITE_OTHER): Payer: 59 | Admitting: Gastroenterology

## 2014-05-15 ENCOUNTER — Other Ambulatory Visit: Payer: Self-pay | Admitting: *Deleted

## 2014-05-15 VITALS — BP 112/68 | HR 76 | Ht 72.0 in | Wt 184.5 lb

## 2014-05-15 DIAGNOSIS — K50118 Crohn's disease of large intestine with other complication: Secondary | ICD-10-CM

## 2014-05-15 DIAGNOSIS — K50918 Crohn's disease, unspecified, with other complication: Secondary | ICD-10-CM

## 2014-05-15 MED ORDER — AZATHIOPRINE 100 MG PO TABS: ORAL_TABLET | ORAL | Status: DC

## 2014-05-15 MED ORDER — BUDESONIDE 3 MG PO CP24: 9.0000 mg | ORAL_CAPSULE | Freq: Every day | ORAL | Status: DC

## 2014-05-15 NOTE — Progress Notes (Signed)
Review of gastrointestinal problems:  1. Crohn's disease. Presented with diarrhea, urgency, right lower quadrant discomfort. Sedimentation rate normal. Colonoscopy July 2011 found normal colon, terminal ileum was ulcerated, inflamed. Biopsies showed active, chronic ileitis. Small bowel follow-through July 2011 confirmed inflammation in terminal ileum, otherwise was normal. August, 2011 CT scan showed no clear bowel disease however there was some right lower quadrant Subcentimeter lymph nodes . Prometheus testing July 2011 was "not consistent with IBD," TPMT phenotype was normal. August, 2011 Entocort caused worse cramping, nausea. September, 2011 prednisone 40 mg a day greatly helped his symptoms however he could not taper past 25-30 mg a day. 04/2010 Evaluated by Antony Blackbird at [facility], was recommended to start immunomodulators but he declined, offered mesalamine and he agreed. Lost to follow up for about 2 years. 06/2012 represented off meds for 2 years, diarrhea, CBC, CMET, sed rate all normal, started pentasa with a very good response quickly. April, 2014: Response to mesalamine product has waned. Colonoscopy 09/2012: moderate terminal ileitis only, normal colon; path confirmed chronic active ileitis. Was started on entocort. 02/2013, entocort helped, when he weaned off his symptoms returned briefly but then improved again without even restarting Entocort. 02/2014: Abdominal pain, change in his bowels, suspicious for flare. Stool test and lab test ordered but he did not do those, I recommended he restart mesalamine at full strength and wean off Entocort which seem to be causing some of his headaches.   HPI: This is a   very pleasant 28 year old man whom I last saw about 2 months ago.  A bit more diarrhea recently, over the weekend overt bleeding, dripping into the toilette. Occurred several times  A few days later, vomiting after eating lunch  After last visit, took lialda 4 pills daily for a few weeks and  was feeling better so he weaned off.    Even when he was on the [place], he was having still several loose stools daily. Currently 7-8 stools daily. Intermittent bleeding, abdominal cramping.  Labs done yesterday showed normal complete metabolic profile, normal CBC    Past Medical History  Diagnosis Date  . 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 Irritable bowel syndrome   . Crohn's disease     Past Surgical History  Procedure Laterality Date  . Tonsillectomy and adenoidectomy  1990's  . Colonoscopy  7/11    Dr. Ardis Hughs    Current Outpatient Prescriptions  Medication Sig Dispense Refill  . hyoscyamine (LEVBID) 0.375 MG 12 hr tablet Take 1 tablet (0.375 mg total) by mouth 2 (two) times daily. 60 tablet 3   No current facility-administered medications for this visit.    Allergies as of 05/15/2014 - Review Complete 05/15/2014  Allergen Reaction Noted  . Amoxicillin Rash 08/09/2007    Family History  Problem Relation Age of Onset  . Healthy Father   . Healthy Mother   . Crohn's disease Cousin   . Coronary artery disease Neg Hx   . Diabetes Neg Hx   . Hypertension      family hx  . Colon cancer Maternal Grandfather   . Colon polyps Maternal Grandfather     History   Social History  . Marital Status: Married    Spouse Name: Danae Chen    Number of Children: 2  . Years of Education: Masters   Occupational History  . Pastor    Social History Main Topics  . Smoking status: Never Smoker   . Smokeless tobacco: Never Used  .  Alcohol Use: No  . Drug Use: No  . Sexual Activity: Not on file   Other Topics Concern  . Not on file   Social History Narrative   Married      1 son      Museum/gallery conservator; [employer]            Physical Exam: BP 112/68 mmHg  Pulse 76  Ht 6' (1.829 m)  Wt 184 lb 8 oz (83.689 kg)  BMI 25.02 kg/m2 Constitutional: generally well-appearing Psychiatric: alert and oriented x3 Abdomen: soft, mildly  tender in the right lower quadrant nondistended, no obvious ascites, no peritoneal signs, normal bowel sounds     Assessment and plan: 28 y.o. male with Crohn's ileitis  I explained to him that I do feel his Crohn's is active and not under good control. He understands that his noncompliance can be playing a significant role here. He weans himself on and off of medicines. He is quite frightened by the bleeding and this may be what was needed to get him to understand the chronic, serious nature of his disease. I do not think that mesalamine alone is going to be working I recommended either immunomodulators or Biologics. He has done a lot of reading and he does not want to try I biologics.  He is going to restart his mesalamine at full strength, he will restart Entocort 9 m, and he is going to start azathioprine 200 mg which is between 2 and 2.5 mg/kg. He'll have stool testing today for Clostridium difficile. He will return for labs in about 2 weeks for CBC, complete metabolic profile and he'll return for an office visit in 4 weeks.

## 2014-05-15 NOTE — Patient Instructions (Addendum)
Restart entocort 86m pills per day, new script written. Start azathiaprine at 2061mper day, new script written. Restart lialda 4 pills once daily. You will have labs checked today in the basement lab.  Please head down after you check out with the front desk  (c. Difficile). Labs in 2 weeks.  CBC CMP Please return to see Dr. JaArdis Hughsn 4 weeks, sooner if needed.  Appointment is 06/16/2014 at 3:15pm

## 2014-06-16 ENCOUNTER — Ambulatory Visit: Payer: 59 | Admitting: Gastroenterology

## 2014-06-24 ENCOUNTER — Telehealth: Payer: Self-pay | Admitting: Gastroenterology

## 2014-06-24 NOTE — Telephone Encounter (Signed)
Pt would like to have zofran or phenergan refill to take with him on a trip he is taking in Feb.  Can I send this?

## 2014-06-25 MED ORDER — ONDANSETRON HCL 4 MG PO TABS: 4.0000 mg | ORAL_TABLET | Freq: Two times a day (BID) | ORAL | Status: DC

## 2014-06-25 NOTE — Telephone Encounter (Signed)
Pt aware that rx has been sent as requested

## 2014-06-25 NOTE — Telephone Encounter (Signed)
Yes, zofran 98m pills, take one pill twice daily as needed for nausea, disp 60 with 3 refills.  thanks

## 2014-06-28 ENCOUNTER — Other Ambulatory Visit: Payer: Self-pay | Admitting: Gastroenterology

## 2014-07-22 ENCOUNTER — Ambulatory Visit (INDEPENDENT_AMBULATORY_CARE_PROVIDER_SITE_OTHER): Payer: BLUE CROSS/BLUE SHIELD | Admitting: Family Medicine

## 2014-07-22 ENCOUNTER — Encounter: Payer: Self-pay | Admitting: Family Medicine

## 2014-07-22 VITALS — BP 120/70 | HR 76 | Temp 98.1°F | Ht 72.0 in | Wt 184.2 lb

## 2014-07-22 DIAGNOSIS — J0100 Acute maxillary sinusitis, unspecified: Secondary | ICD-10-CM

## 2014-07-22 MED ORDER — AZITHROMYCIN 250 MG PO TABS: ORAL_TABLET | ORAL | Status: DC

## 2014-07-22 MED ORDER — HYDROCODONE-HOMATROPINE 5-1.5 MG/5ML PO SYRP: ORAL_SOLUTION | ORAL | Status: DC

## 2014-07-22 MED ORDER — PREDNISONE 20 MG PO TABS: ORAL_TABLET | ORAL | Status: DC

## 2014-07-22 NOTE — Progress Notes (Signed)
Pre visit review using our clinic review tool, if applicable. No additional management support is needed unless otherwise documented below in the visit note. 

## 2014-07-22 NOTE — Progress Notes (Signed)
Dr. Frederico Hamman T. Pamalee Marcoe, MD, [facility] Primary [facility] and Sports Medicine [place], [address] Phone: [phone] Fax: [phone]  07/22/2014  Patient: Russell [name], MRN: [id number], DOB: 1985/11/05, 29 y.o.  Primary Physician:  Owens Loffler, MD  Chief Complaint: Sinusitis  Subjective:   Russell [name] is a 29 y.o. very pleasant male patient who presents with the following:  13th day, draining a lot, then woke up stopped up. Last week, voice a little. Tried some advil cold and sinus. Coughing a lot at night. Yellow congealed nasty styff. Throat is sore about all the time. Raw throat. He has a significant history of sinus disease, and now over the last few days his pain has solidified into the maxillary sinus region and in his upper teeth.  He is currently afebrile.  He has been trying a multitude of over-the-counter remedies without any significant success in relieving his symptoms.  He also is having a bad cough.  Going out of the country to [place].     Past Medical History, Surgical History, Social History, Family History, Problem List, Medications, and Allergies have been reviewed and updated if relevant.  ROS: GEN: Acute illness details above GI: Tolerating PO intake GU: maintaining adequate hydration and urination Pulm: No SOB Interactive and getting along well at home.  Otherwise, ROS is as per the HPI.   Objective:   BP 120/70 mmHg  Pulse 76  Temp(Src) 98.1 F (36.7 C) (Oral)  Ht 6' (1.829 m)  Wt 184 lb 4 oz (83.575 kg)  BMI 24.98 kg/m2   Gen: WDWN, NAD; alert,appropriate and cooperative throughout exam  HEENT: Normocephalic and atraumatic. Throat clear, w/o exudate, no LAD, R TM clear, L TM - good landmarks, No fluid present. rhinnorhea.  Left frontal and maxillary sinuses: Tender, max Right frontal and maxillary sinuses: Tender, max  Neck: No ant or post LAD CV: RRR, No M/G/R Pulm: Breathing comfortably in no resp  distress. no w/c/r Abd: S,NT,ND,+BS Extr: no c/c/e Psych: full affect, pleasant    Laboratory and Imaging Data:  Assessment and Plan:   Acute maxillary sinusitis, recurrence not specified  Likely preceding URI turning into sinusitis.  Penicillin ALLERGY, treat with macrolide.  Given failure of other over-the-counter treatments and significant pulmonary symptoms, I am also going to give him some prednisone.  Cough syrup when necessary.  Follow-up: No Follow-up on file.  New Prescriptions   AZITHROMYCIN (ZITHROMAX) 250 MG TABLET    2 tabs po on day 1, then 1 tab po for 4 days   HYDROCODONE-HOMATROPINE (HYCODAN) 5-1.5 MG/5ML SYRUP    1 tsp po at night before bed prn cough   PREDNISONE (DELTASONE) 20 MG TABLET    2 tabs for 4 days, then 1 tab for 4 days   No orders of the defined types were placed in this encounter.    Signed,  Maud Deed. Ac Colan, MD   Patient's Medications  New Prescriptions   AZITHROMYCIN (ZITHROMAX) 250 MG TABLET    2 tabs po on day 1, then 1 tab po for 4 days   HYDROCODONE-HOMATROPINE (HYCODAN) 5-1.5 MG/5ML SYRUP    1 tsp po at night before bed prn cough   PREDNISONE (DELTASONE) 20 MG TABLET    2 tabs for 4 days, then 1 tab for 4 days  Previous Medications   No medications on file  Modified Medications   No medications on file  Discontinued Medications   AZATHIOPRINE (IMURAN) 100 MG TABLET  Take twp pills once a day   BUDESONIDE (ENTOCORT EC) 3 MG 24 HR CAPSULE    Take 3 capsules (9 mg total) by mouth daily.   HYOSCYAMINE (LEVBID) 0.375 MG 12 HR TABLET    Take 1 tablet (0.375 mg total) by mouth 2 (two) times daily.   ONDANSETRON (ZOFRAN) 4 MG TABLET    Take 1 tablet (4 mg total) by mouth 2 (two) times daily.   PROMETHAZINE (PHENERGAN) 25 MG TABLET    TAKE 1 TABLET BY MOUTH EVERY 6 HOURS AS NEEDED FOR NAUSEA OR VOMITING.

## 2014-08-03 ENCOUNTER — Ambulatory Visit (INDEPENDENT_AMBULATORY_CARE_PROVIDER_SITE_OTHER): Payer: BLUE CROSS/BLUE SHIELD | Admitting: Gastroenterology

## 2014-08-03 ENCOUNTER — Encounter: Payer: Self-pay | Admitting: Gastroenterology

## 2014-08-03 VITALS — BP 110/68 | HR 60 | Ht 71.5 in | Wt 185.5 lb

## 2014-08-03 DIAGNOSIS — K5090 Crohn's disease, unspecified, without complications: Secondary | ICD-10-CM

## 2014-08-03 NOTE — Progress Notes (Signed)
Review of gastrointestinal problems:  1. Crohn's disease. Presented with diarrhea, urgency, right lower quadrant discomfort. Sedimentation rate normal. Colonoscopy July 2011 found normal colon, terminal ileum was ulcerated, inflamed. Biopsies showed active, chronic ileitis. Small bowel follow-through July 2011 confirmed inflammation in terminal ileum, otherwise was normal. August, 2011 CT scan showed no clear bowel disease however there was some right lower quadrant Subcentimeter lymph nodes . Prometheus testing July 2011 was "not consistent with IBD," TPMT phenotype was normal. August, 2011 Entocort caused worse cramping, nausea. September, 2011 prednisone 40 mg a day greatly helped his symptoms however he could not taper past 25-30 mg a day. 04/2010 Evaluated by Antony Blackbird at [facility], was recommended to start immunomodulators but he declined, offered mesalamine and he agreed. Lost to follow up for about 2 years. 06/2012 represented off meds for 2 years, diarrhea, CBC, CMET, sed rate all normal, started pentasa with a very good response quickly. April, 2014: Response to mesalamine product has waned. Colonoscopy 09/2012: moderate terminal ileitis only, normal colon; path confirmed chronic active ileitis. Was started on entocort. 02/2013, entocort helped, when he weaned off his symptoms returned briefly but then improved again without even restarting Entocort. 02/2014: Abdominal pain, change in his bowels, suspicious for flare. Stool test and lab test ordered but he did not do those, I recommended he restart mesalamine at full strength and wean off Entocort which seem to be causing some of his headaches. 05/2015 recommended azathiaprine 235m daily, restart Entocort. He restart the Entocort but did not try azathioprine.  HPI: This is a very pleasant 29year old man whom I last saw about 3 months ago.  Feeling great.  No diarrhea in 3 weeks.  He is on zero medicines currently.  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  He has changed some eating habits; avoiding spicy foods, deep fried foods.  He continued entocort for several weeks after last visit, then slowly weaned off over a few weeks.  He never started azathiaprine.  Was not bothered by headaches with entocort this time.  Not bothered by pyrosis.  Two solid BMs daily, no constipatoin, no bleeding.  Almost never takes NSAIDs, non smoker, non drinker. Has cut back on caffeine dramatically!  Past Medical History  Diagnosis Date  . 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 Irritable bowel syndrome   . Crohn's disease     Past Surgical History  Procedure Laterality Date  . Tonsillectomy and adenoidectomy  1990's  . Colonoscopy  7/11    Dr. JArdis Hughs   No current outpatient prescriptions on file.   No current facility-administered medications for this visit.    Allergies as of 08/03/2014 - Review Complete 08/03/2014  Allergen Reaction Noted  . Amoxicillin Rash 08/09/2007    Family History  Problem Relation Age of Onset  . Healthy Father   . Healthy Mother   . Crohn's disease Cousin   . Coronary artery disease Neg Hx   . Diabetes Neg Hx   . Hypertension      family hx  . Colon cancer Maternal Grandfather   . Colon polyps Maternal Grandfather     History   Social History  . Marital Status: Married    Spouse Name: EDanae Chen . Number of Children: 2  . Years of Education: Masters   Occupational History  . Pastor    Social History Main Topics  . Smoking  status: Never Smoker   . Smokeless tobacco: Never Used  . Alcohol Use: No  . Drug Use: No  . Sexual Activity: Not on file   Other Topics Concern  . Not on file   Social History Narrative   Married      1 son      Museum/gallery conservator; Psychologist, counselling            Physical Exam: BP 110/68 mmHg  Pulse 60  Ht 5' 11.5" (1.816 m)  Wt 185 lb 8 oz (84.142 kg)  BMI 25.51  kg/m2 Constitutional: generally well-appearing Psychiatric: alert and oriented x3 Abdomen: soft, nontender, nondistended, no obvious ascites, no peritoneal signs, normal bowel sounds     Assessment and plan: 29 y.o. male with Crohn's ileitis  He is absolutely feeling great on 0 Crohn's medicines. He never started the azathioprine that I recommended however he did get back on Entocort and taper himself off over several weeks as he improved. I am most interested by the fact that he completely stop drinking caffeine around this time as well. He had been drinking a full pot of coffee every morning and 6-8 Coca-Cola's daily. Over the holidays he cut this out completely except for 1 medium-sized cup of coffee every day. Perhaps this is been a driving influence on his diarrhea for some time. Currently he is on 0 Crohn's medicines and I recommended he not restart anything at this point we'll simply observe him clinically. He'll return to see me in 3 months and sooner if needed.

## 2014-08-03 NOTE — Patient Instructions (Signed)
Try to not restart your heavy caffeine intake, EVER! Please return to see Dr. Ardis Hughs in 3 months, sooner if needed. Cancel if you are doing great.

## 2014-11-03 ENCOUNTER — Ambulatory Visit (INDEPENDENT_AMBULATORY_CARE_PROVIDER_SITE_OTHER): Payer: BLUE CROSS/BLUE SHIELD | Admitting: Primary Care

## 2014-11-03 ENCOUNTER — Encounter: Payer: Self-pay | Admitting: Primary Care

## 2014-11-03 VITALS — BP 138/84 | HR 82 | Temp 98.3°F | Ht 71.5 in | Wt 183.8 lb

## 2014-11-03 DIAGNOSIS — R05 Cough: Secondary | ICD-10-CM | POA: Diagnosis not present

## 2014-11-03 DIAGNOSIS — R059 Cough, unspecified: Secondary | ICD-10-CM

## 2014-11-03 MED ORDER — AZITHROMYCIN 250 MG PO TABS: ORAL_TABLET | ORAL | Status: DC

## 2014-11-03 MED ORDER — BENZONATATE 200 MG PO CAPS: 200.0000 mg | ORAL_CAPSULE | Freq: Three times a day (TID) | ORAL | Status: DC | PRN

## 2014-11-03 MED ORDER — HYDROCODONE-HOMATROPINE 5-1.5 MG/5ML PO SYRP
5.0000 mL | ORAL_SOLUTION | Freq: Every evening | ORAL | Status: DC | PRN
Start: 2014-11-03 — End: 2015-02-18

## 2014-11-03 NOTE — Progress Notes (Signed)
Subjective:    Patient ID: Russell [name], male    DOB: Sep 15, 1985, 29 y.o.   MRN: [id number]  HPI  Russell [name] is a 29 year old male who presents today with a chief complaint of cough and sore throat. He has a history of seasonal allergies and will develop symptoms of sinus pressure and nasal congestion twice a year, but never like it has recently. He's had allergy symptoms for the past several weeks. His symptoms started with a sore throat that began last Wednesday with the progression of a productive cough with greenish sputum and fevers. Yesterday he started feeling worse with fevers, body aches, and worsening cough. He's tired taking Mucinex,and Advil Cold and Sinus without any improvment. Nothing makes symptoms better.  Review of Systems  Constitutional: Positive for fever. Negative for chills.  HENT: Positive for congestion, sinus pressure and sore throat. Negative for ear pain and rhinorrhea.   Respiratory: Negative for shortness of breath.   Cardiovascular: Negative for chest pain.  Gastrointestinal: Negative for nausea and vomiting.       Past Medical History  Diagnosis Date  . 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 Irritable bowel syndrome   . Crohn's disease     History   Social History  . Marital Status: Married    Spouse Name: Russell [name]  . Number of Children: 2  . Years of Education: Masters   Occupational History  . Pastor    Social History Main Topics  . Smoking status: Never Smoker   . Smokeless tobacco: Never Used  . Alcohol Use: No  . Drug Use: No  . Sexual Activity: Not on file   Other Topics Concern  . Not on file   Social History Narrative   Married      1 son      Youth minister; Psychologist, counselling          Past Surgical History  Procedure Laterality Date  . Tonsillectomy and adenoidectomy  1990's  . Colonoscopy  7/11    Dr. Ardis Hughs    Family History  Problem Relation Age of Onset  . Healthy Father   .  Healthy Mother   . Crohn's disease Cousin   . Coronary artery disease Neg Hx   . Diabetes Neg Hx   . Hypertension      family hx  . Colon cancer Maternal Grandfather   . Colon polyps Maternal Grandfather     Allergies  Allergen Reactions  . Amoxicillin Rash    No current outpatient prescriptions on file prior to visit.   No current facility-administered medications on file prior to visit.    BP 138/84 mmHg  Pulse 82  Temp(Src) 98.3 F (36.8 C) (Oral)  Ht 5' 11.5" (1.816 m)  Wt 183 lb 12.8 oz (83.371 kg)  BMI 25.28 kg/m2  SpO2 97%    Objective:   Physical Exam  Constitutional: He is oriented to person, place, and time. He appears ill.  HENT:  Nose: Right sinus exhibits maxillary sinus tenderness. Left sinus exhibits maxillary sinus tenderness.  Mouth/Throat: Oropharynx is clear and moist.  Eyes: Conjunctivae are normal. Pupils are equal, round, and reactive to light.  Neck: Neck supple.  Cardiovascular: Normal rate and regular rhythm.   Pulmonary/Chest: Effort normal and breath sounds normal.  Lymphadenopathy:    He has no cervical adenopathy.  Neurological: He is alert and oriented to person, place, and time.  Skin: Skin is warm and  dry.          Assessment & Plan:  Bronchitis:  Suspect probable bacterial involvement due to duration and progression of symptoms. RX for Zpak, Tesssalon Pearls for day cough, Hycodan at night. Push water, rest. Call if no improvement in the next 3-4 days. Education provided on post bronchitic cough.

## 2014-11-03 NOTE — Progress Notes (Signed)
Pre visit review using our clinic review tool, if applicable. No additional management support is needed unless otherwise documented below in the visit note. 

## 2014-11-03 NOTE — Patient Instructions (Addendum)
Start Azithromycin antibiotics. Take 2 tablet by mouth today, then 1 tablet by mouth daily for 4 additional days. You may take the Benzonatate capsules three times daily as needed for cough. You may take the Hycodan at bedtime for cough and sleep. Consider a daily Claritin. Allegra, or Zyrtec for allergy symptoms once your cold has resolved. It was nice meeting you!

## 2014-11-06 ENCOUNTER — Telehealth: Payer: Self-pay | Admitting: Family Medicine

## 2014-11-06 NOTE — Telephone Encounter (Signed)
Attempted to call back. No answer, left voicemail.

## 2014-11-06 NOTE — Telephone Encounter (Signed)
Spouse called stating mr Bellis saw kate on Tuesday.  He has been med as prescribed.  He is not any better.  Leaving to go on vacation tomorrow and wanted to know if you could call something else in for him  [facility] [place]

## 2015-02-18 ENCOUNTER — Ambulatory Visit (INDEPENDENT_AMBULATORY_CARE_PROVIDER_SITE_OTHER): Payer: BLUE CROSS/BLUE SHIELD | Admitting: Family Medicine

## 2015-02-18 ENCOUNTER — Encounter: Payer: Self-pay | Admitting: Family Medicine

## 2015-02-18 VITALS — BP 136/84 | HR 80 | Temp 98.0°F | Wt 190.2 lb

## 2015-02-18 DIAGNOSIS — R0981 Nasal congestion: Secondary | ICD-10-CM | POA: Diagnosis not present

## 2015-02-18 MED ORDER — DOXYCYCLINE HYCLATE 100 MG PO TABS: 100.0000 mg | ORAL_TABLET | Freq: Two times a day (BID) | ORAL | Status: DC

## 2015-02-18 MED ORDER — FLUTICASONE PROPIONATE 50 MCG/ACT NA SUSP: 2.0000 | Freq: Every day | NASAL | Status: DC

## 2015-02-18 MED ORDER — CETIRIZINE-PSEUDOEPHEDRINE ER 5-120 MG PO TB12: 1.0000 | ORAL_TABLET | Freq: Every day | ORAL | Status: DC

## 2015-02-18 NOTE — Progress Notes (Signed)
Pre visit review using our clinic review tool, if applicable. No additional management support is needed unless otherwise documented below in the visit note. 

## 2015-02-18 NOTE — Progress Notes (Signed)
   BP 136/84 mmHg  Pulse 80  Temp(Src) 98 F (36.7 C) (Oral)  Wt 190 lb 4 oz (86.297 kg)   CC: dizziness, sinus congestion Subjective:    Patient ID: Russell [name], male    DOB: 05-05-1986, 29 y.o.   MRN: [id number]  HPI: GUILFORD SHANNAHAN is a 29 y.o. male presenting on 02/18/2015 for Dizziness   3 wk h/o lightheadedness "swimmy head" then over last 1 week noticing intermittent fullness/pulsating discomfort in ears. Some sinus congestion and pressure (ongoing issue).   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 Treated with meclizine 12.52m which helped.   No nausea, vertigo. No fevers/chills, PNDrainage. No cough. No sick contacts at home. No smokers at home. No h/o asthma.   + h/o seasonal allergic rhinitis usually takes prn antihistamine. Has not started this yet.  Relevant past medical, surgical, family and social history reviewed and updated as indicated. Interim medical history since our last visit reviewed. Allergies and medications reviewed and updated. No current outpatient prescriptions on file prior to visit.   No current facility-administered medications on file prior to visit.    Review of Systems Per HPI unless specifically indicated above     Objective:    BP 136/84 mmHg  Pulse 80  Temp(Src) 98 F (36.7 C) (Oral)  Wt 190 lb 4 oz (86.297 kg)  Wt Readings from Last 3 Encounters:  02/18/15 190 lb 4 oz (86.297 kg)  11/03/14 183 lb 12.8 oz (83.371 kg)  08/03/14 185 lb 8 oz (84.142 kg)    Physical Exam  Constitutional: He appears well-developed and well-nourished. No distress.  HENT:  Head: Normocephalic and atraumatic.  Right Ear: Hearing, tympanic membrane, external ear and ear canal normal.  Left Ear: Hearing, tympanic membrane, external ear and ear canal normal.  Nose: No mucosal edema or rhinorrhea. Right sinus exhibits maxillary sinus tenderness. Right sinus exhibits no frontal sinus tenderness. Left sinus exhibits maxillary  sinus tenderness. Left sinus exhibits no frontal sinus tenderness.  Mouth/Throat: Uvula is midline, oropharynx is clear and moist and mucous membranes are normal. No oropharyngeal exudate, posterior oropharyngeal edema, posterior oropharyngeal erythema or tonsillar abscesses.  Small amt fluid bilaterally behind TMs Tender to palpation angle of jaw bilaterally  Eyes: Conjunctivae and EOM are normal. Pupils are equal, round, and reactive to light. No scleral icterus.  Neck: Normal range of motion. Neck supple.  Cardiovascular: Normal rate, regular rhythm, normal heart sounds and intact distal pulses.   No murmur heard. Pulmonary/Chest: Effort normal and breath sounds normal. No respiratory distress. He has no wheezes. He has no rales.  Lymphadenopathy:    He has no cervical adenopathy.  Skin: Skin is warm and dry. No rash noted.  Nursing note and vitals reviewed.     Assessment & Plan:   Problem List Items Addressed This Visit    Nasal sinus congestion - Primary    Sinus congestion/inflammation, serous otitis, and dizziness anticipate allergic not infectious. Treat with zyrtec D x1 wk, flonase. Supportive care further discussed. Pt avoids NSAIDs 2/2 crohn's disease. If not improved will recommend he take antibiotic course (doxy WASP provided). Pt agrees with plan.          Follow up plan: Return if symptoms worsen or fail to improve.

## 2015-02-18 NOTE — Assessment & Plan Note (Addendum)
Sinus congestion/inflammation, serous otitis, and dizziness anticipate allergic not infectious. Treat with zyrtec D x1 wk, flonase. Supportive care further discussed. Pt avoids NSAIDs 2/2 crohn's disease. If not improved will recommend he take antibiotic course (doxy WASP provided). Pt agrees with plan.

## 2015-02-18 NOTE — Patient Instructions (Signed)
I think you have sinus congestion/inflammation from allergies leading to dizziness. Treat with zyrtec D plus flonase for 1 week. If feeling bad on zyrtec D, stop and just continue flonase. If no better, fill antibiotic provided today. Let us know if not improved with this.

## 2015-04-13 ENCOUNTER — Telehealth: Payer: Self-pay | Admitting: Gastroenterology

## 2015-04-13 MED ORDER — PROMETHAZINE HCL 25 MG PO TABS
25.0000 mg | ORAL_TABLET | Freq: Four times a day (QID) | ORAL | Status: DC | PRN
Start: 2015-04-13 — End: 2015-06-23

## 2015-04-13 NOTE — Telephone Encounter (Signed)
Pt aware of prescription

## 2015-04-13 NOTE — Telephone Encounter (Signed)
Yes, phenergan 68m pills, take 1 pill every 6 hours as needed for nausea.  Disp 50 with 2 refills  Thanks

## 2015-04-13 NOTE — Telephone Encounter (Signed)
Dr Ardis Hughs is this ok to send?

## 2015-06-23 ENCOUNTER — Encounter: Payer: Self-pay | Admitting: Primary Care

## 2015-06-23 ENCOUNTER — Ambulatory Visit (INDEPENDENT_AMBULATORY_CARE_PROVIDER_SITE_OTHER): Payer: BLUE CROSS/BLUE SHIELD | Admitting: Primary Care

## 2015-06-23 VITALS — BP 136/76 | HR 71 | Temp 97.9°F | Ht 71.5 in | Wt 190.0 lb

## 2015-06-23 DIAGNOSIS — R05 Cough: Secondary | ICD-10-CM

## 2015-06-23 DIAGNOSIS — J329 Chronic sinusitis, unspecified: Secondary | ICD-10-CM

## 2015-06-23 DIAGNOSIS — R059 Cough, unspecified: Secondary | ICD-10-CM

## 2015-06-23 MED ORDER — DOXYCYCLINE HYCLATE 100 MG PO TABS: 100.0000 mg | ORAL_TABLET | Freq: Two times a day (BID) | ORAL | Status: DC

## 2015-06-23 MED ORDER — HYDROCODONE-HOMATROPINE 5-1.5 MG/5ML PO SYRP: 5.0000 mL | ORAL_SOLUTION | Freq: Every evening | ORAL | Status: DC | PRN

## 2015-06-23 NOTE — Progress Notes (Signed)
Subjective:    Patient ID: Russell [name], male    DOB: 1985-10-19, 30 y.o.   MRN: [id number]  HPI  Russell [name] is a 30 year old male who presents today with a chief complaint of sinus pressure. He also reports cough, sore throat, body aches, nasal congestion. His cough is productive with yellow sputum. He was treated in May and September 2016 for probable infectious process to nasal cavity/sinuses. He was evaluated by ENT (Dr. Redmond Baseman) and was told to follow up if these symptoms persist more than twice annually.   His symptoms initially began 10 days ago and since then have gradually become worse. He's blowing green/yellow mucous from his nasal cavity. His most bothersome symptom is his sinus pressure and drainage. He's taken Flonase and Allegra-D without improvement. Denies fevers, chills, nausea.   Review of Systems  Constitutional: Negative for fever and chills.  HENT: Positive for congestion, rhinorrhea, sinus pressure and sore throat. Negative for ear pain.   Respiratory: Positive for cough. Negative for shortness of breath.   Cardiovascular: Negative for chest pain.  Gastrointestinal: Negative for nausea.  Neurological: Positive for headaches.       Past Medical History  Diagnosis Date  . Regional enteritis of small intestine ([place]) 7/11    Crohn's ileitis  . Irritable bowel syndrome   . Crohn's disease [facility])     Social History   Social History  . Marital Status: Married    Spouse Name: Danae Chen  . Number of Children: 2  . Years of Education: Masters   Occupational History  . Pastor    Social History Main Topics  . Smoking status: Never Smoker   . Smokeless tobacco: Never Used  . Alcohol Use: No  . Drug Use: No  . Sexual Activity: Not on file   Other Topics Concern  . Not on file   Social History Narrative   Married      1 son      Youth minister; Psychologist, counselling          Past Surgical History  Procedure Laterality Date  . Tonsillectomy and  adenoidectomy  1990's  . Colonoscopy  7/11    Dr. Ardis Hughs    Family History  Problem Relation Age of Onset  . Healthy Father   . Healthy Mother   . Crohn's disease Cousin   . Coronary artery disease Neg Hx   . Diabetes Neg Hx   . Hypertension      family hx  . Colon cancer Maternal Grandfather   . Colon polyps Maternal Grandfather     Allergies  Allergen Reactions  . Amoxicillin Rash    Current Outpatient Prescriptions on File Prior to Visit  Medication Sig Dispense Refill  . cetirizine-pseudoephedrine (ZYRTEC-D) 5-120 MG per tablet Take 1 tablet by mouth daily. (Patient not taking: Reported on 06/23/2015)    . fluticasone (FLONASE) 50 MCG/ACT nasal spray Place 2 sprays into both nostrils daily. (Patient not taking: Reported on 06/23/2015) 16 g 1   No current facility-administered medications on file prior to visit.    BP 136/76 mmHg  Pulse 71  Temp(Src) 97.9 F (36.6 C) (Oral)  Ht 5' 11.5" (1.816 m)  Wt 190 lb (86.183 kg)  BMI 26.13 kg/m2  SpO2 98%    Objective:   Physical Exam  Constitutional: He appears well-nourished.  HENT:  Right Ear: Tympanic membrane and ear canal normal.  Left Ear: Ear canal normal. Tympanic membrane is bulging. Tympanic membrane is not  erythematous.  Nose: Mucosal edema present. Right sinus exhibits maxillary sinus tenderness and frontal sinus tenderness. Left sinus exhibits maxillary sinus tenderness and frontal sinus tenderness.  Mouth/Throat: Oropharynx is clear and moist.  Eyes: Conjunctivae are normal.  Neck: Neck supple.  Cardiovascular: Normal rate and regular rhythm.   Pulmonary/Chest: Effort normal. He has no wheezes. He has no rales.  Lymphadenopathy:    He has no cervical adenopathy.  Skin: Skin is warm and dry.          Assessment & Plan:  Acute Bacterial Sinusitis:  Maxillary sinus pressure and nasal congestion x 10 days. Gradually worse with green/yellow mucous from nasal cavity. No improvement with OTC  treatment. Will treat with Doxycycline course today for presumed bacterial involvement. Continue Flonase, RX for Hycodan for HS cough provided. Fluids, rest. Recommended he follow up with his ENT provider if symptoms return this year.  He verbalized understanding.

## 2015-06-23 NOTE — Patient Instructions (Signed)
Start Doxycycline antibiotic. Take 1 tablet by mouth twice daily for 10 days.  You may take the Hycodan cough suppressant at bedtime as needed for cough and rest. Caution this medication contains codeine and will make you feel drowsy.  Continue Flonase as needed for nasal congestion.  Increase consumption of fluids and rest.  It was a pleasure to see you today!  Sinusitis, Adult Sinusitis is redness, soreness, and inflammation of the paranasal sinuses. Paranasal sinuses are air pockets within the bones of your face. They are located beneath your eyes, in the middle of your forehead, and above your eyes. In healthy paranasal sinuses, mucus is able to drain out, and air is able to circulate through them by way of your nose. However, when your paranasal sinuses are inflamed, mucus and air can become trapped. This can allow bacteria and other germs to grow and cause infection. Sinusitis can develop quickly and last only a short time (acute) or continue over a long period (chronic). Sinusitis that lasts for more than 12 weeks is considered chronic. CAUSES Causes of sinusitis include:  Allergies.  Structural abnormalities, such as displacement of the cartilage that separates your nostrils (deviated septum), which can decrease the air flow through your nose and sinuses and affect sinus drainage.  Functional abnormalities, such as when the small hairs (cilia) that line your sinuses and help remove mucus do not work properly or are not present. SIGNS AND SYMPTOMS Symptoms of acute and chronic sinusitis are the same. The primary symptoms are pain and pressure around the affected sinuses. Other symptoms include:  Upper toothache.  Earache.  Headache.  Bad breath.  Decreased sense of smell and taste.  A cough, which worsens when you are lying flat.  Fatigue.  Fever.  Thick drainage from your nose, which often is green and may contain pus (purulent).  Swelling and warmth over the  affected sinuses. DIAGNOSIS Your health care provider will perform a physical exam. During your exam, your health care provider may perform any of the following to help determine if you have acute sinusitis or chronic sinusitis:  Look in your nose for signs of abnormal growths in your nostrils (nasal polyps).  Tap over the affected sinus to check for signs of infection.  View the inside of your sinuses using an imaging device that has a light attached (endoscope). If your health care provider suspects that you have chronic sinusitis, one or more of the following tests may be recommended:  Allergy tests.  Nasal culture. A sample of mucus is taken from your nose, sent to a lab, and screened for bacteria.  Nasal cytology. A sample of mucus is taken from your nose and examined by your health care provider to determine if your sinusitis is related to an allergy. TREATMENT Most cases of acute sinusitis are related to a viral infection and will resolve on their own within 10 days. Sometimes, medicines are prescribed to help relieve symptoms of both acute and chronic sinusitis. These may include pain medicines, decongestants, nasal steroid sprays, or saline sprays. However, for sinusitis related to a bacterial infection, your health care provider will prescribe antibiotic medicines. These are medicines that will help kill the bacteria causing the infection. Rarely, sinusitis is caused by a fungal infection. In these cases, your health care provider will prescribe antifungal medicine. For some cases of chronic sinusitis, surgery is needed. Generally, these are cases in which sinusitis recurs more than 3 times per year, despite other treatments. HOME CARE INSTRUCTIONS  Drink plenty of water. Water helps thin the mucus so your sinuses can drain more easily.  Use a humidifier.  Inhale steam 3-4 times a day (for example, sit in the bathroom with the shower running).  Apply a warm, moist washcloth to  your face 3-4 times a day, or as directed by your health care provider.  Use saline nasal sprays to help moisten and clean your sinuses.  Take medicines only as directed by your health care provider.  If you were prescribed either an antibiotic or antifungal medicine, finish it all even if you start to feel better. SEEK IMMEDIATE MEDICAL CARE IF:  You have increasing pain or severe headaches.  You have nausea, vomiting, or drowsiness.  You have swelling around your face.  You have vision problems.  You have a stiff neck.  You have difficulty breathing.   This information is not intended to replace advice given to you by your health care provider. Make sure you discuss any questions you have with your health care provider.   Document Released: 05/29/2005 Document Revised: 06/19/2014 Document Reviewed: 06/13/2011 Elsevier Interactive Patient Education [employer].

## 2015-08-19 ENCOUNTER — Encounter: Payer: Self-pay | Admitting: Family Medicine

## 2015-08-19 ENCOUNTER — Ambulatory Visit (INDEPENDENT_AMBULATORY_CARE_PROVIDER_SITE_OTHER): Payer: BLUE CROSS/BLUE SHIELD | Admitting: Family Medicine

## 2015-08-19 VITALS — BP 120/62 | HR 66 | Temp 97.8°F | Wt 190.8 lb

## 2015-08-19 DIAGNOSIS — R109 Unspecified abdominal pain: Secondary | ICD-10-CM | POA: Insufficient documentation

## 2015-08-19 DIAGNOSIS — N5082 Scrotal pain: Secondary | ICD-10-CM | POA: Diagnosis not present

## 2015-08-19 DIAGNOSIS — R1033 Periumbilical pain: Secondary | ICD-10-CM

## 2015-08-19 NOTE — Addendum Note (Signed)
Addended by: Lucille Passy on: 08/19/2015 03:03 PM   Modules accepted: Orders

## 2015-08-19 NOTE — Assessment & Plan Note (Signed)
?  Indirect inguinal hernia but difficult to assess due to pain. Stat left scrotal US now. The patient indicates understanding of these issues and agrees with the plan.

## 2015-08-19 NOTE — Progress Notes (Signed)
Pre visit review using our clinic review tool, if applicable. No additional management support is needed unless otherwise documented below in the visit note. 

## 2015-08-19 NOTE — Progress Notes (Signed)
Subjective:   Patient ID: Russell [name], male    DOB: Jun 15, 1985, 30 y.o.   MRN: [id number]  Russell [name] is a pleasant 30 y.o. year old male pt of Dr. Sigmund Hazel, new to me, who presents to clinic today with Abdominal Pain  on 08/19/2015  HPI: Russell [name] on a 10 mile hike yesterday.  Noticed at the end of it that his left testicle was hurting. As the night went on, pain with walking and movement intensified and radiates up towards his abdomen.  No fevers or chills.  No nausea or vomiting.  Does have h/o Chron's but this is in remission.  No current outpatient prescriptions on file prior to visit.   No current facility-administered medications on file prior to visit.    Allergies  Allergen Reactions  . Amoxicillin Rash    Past Medical History  Diagnosis Date  . Regional enteritis of small intestine ([place]) 7/11    Crohn's ileitis  . Irritable bowel syndrome   . Crohn's disease [facility])     Past Surgical History  Procedure Laterality Date  . Tonsillectomy and adenoidectomy  1990's  . Colonoscopy  7/11    Dr. Ardis Hughs    Family History  Problem Relation Age of Onset  . Healthy Father   . Healthy Mother   . Crohn's disease Cousin   . Coronary artery disease Neg Hx   . Diabetes Neg Hx   . Hypertension      family hx  . Colon cancer Maternal Grandfather   . Colon polyps Maternal Grandfather     Social History   Social History  . Marital Status: Married    Spouse Name: Danae Chen  . Number of Children: 2  . Years of Education: Masters   Occupational History  . Pastor    Social History Main Topics  . Smoking status: Never Smoker   . Smokeless tobacco: Never Used  . Alcohol Use: No  . Drug Use: No  . Sexual Activity: Not on file   Other Topics Concern  . Not on file   Social History Narrative   Married      1 son      Youth minister; Psychologist, counselling         The PMH, PSH, Social History, Family History, Medications, and allergies have been reviewed  in [facility], and have been updated if relevant.  Review of Systems  Constitutional: Negative.   Gastrointestinal: Negative.   Genitourinary: Positive for testicular pain. Negative for hematuria, discharge, penile swelling, scrotal swelling and penile pain.  Neurological: Negative.   Hematological: Negative.   Psychiatric/Behavioral: Negative.   All other systems reviewed and are negative.      Objective:    BP 120/62 mmHg  Pulse 66  Temp(Src) 97.8 F (36.6 C) (Oral)  Wt 190 lb 12 oz (86.524 kg)  SpO2 98%   Physical Exam  Constitutional: He is oriented to person, place, and time. He appears well-developed and well-nourished. No distress.  HENT:  Head: Normocephalic and atraumatic.  Eyes: Conjunctivae are normal.  Cardiovascular: Normal rate.   Pulmonary/Chest: Effort normal.  Abdominal: Soft. Bowel sounds are normal. He exhibits no distension and no mass. There is no tenderness. There is no rebound and no guarding.  Genitourinary: Penis normal. Right testis shows no mass, no swelling and no tenderness. Left testis shows tenderness. Left testis shows no mass and no swelling. Circumcised.  Extremely TTP with inguinal exam Probably left indirect inguinal hernia but difficult to assess  due to pain  Neurological: He is alert and oriented to person, place, and time. No cranial nerve deficit.  Skin: Skin is warm and dry. He is not diaphoretic.  Psychiatric: He has a normal mood and affect. His behavior is normal. Judgment and thought content normal.  Nursing note and vitals reviewed.         Assessment & Plan:   Periumbilical abdominal pain  Scrotal pain - Plan: US Scrotum No Follow-up on file.

## 2015-08-19 NOTE — Patient Instructions (Signed)
Nice to meet you. Please stop by to see Russell [name] on your way out.  If your pain worsens, please go straight to the ER.

## 2015-08-23 ENCOUNTER — Ambulatory Visit (HOSPITAL_COMMUNITY)
Admission: RE | Admit: 2015-08-23 | Discharge: 2015-08-23 | Disposition: A | Discharge status: Home / Self Care | Payer: BLUE CROSS/BLUE SHIELD | Source: Ambulatory Visit | Attending: Family Medicine | Admitting: Family Medicine

## 2015-08-23 ENCOUNTER — Telehealth: Payer: Self-pay | Admitting: Family Medicine

## 2015-08-23 DIAGNOSIS — N503 Cyst of epididymis: Secondary | ICD-10-CM | POA: Insufficient documentation

## 2015-08-23 DIAGNOSIS — N5082 Scrotal pain: Secondary | ICD-10-CM | POA: Diagnosis not present

## 2015-08-23 DIAGNOSIS — N433 Hydrocele, unspecified: Secondary | ICD-10-CM | POA: Insufficient documentation

## 2015-08-23 NOTE — Telephone Encounter (Signed)
Pt called checking on his stat ultra sound  He had done today

## 2015-08-24 ENCOUNTER — Other Ambulatory Visit: Payer: Self-pay | Admitting: Family Medicine

## 2015-08-24 DIAGNOSIS — N50819 Testicular pain, unspecified: Secondary | ICD-10-CM

## 2015-09-27 DIAGNOSIS — M531 Cervicobrachial syndrome: Secondary | ICD-10-CM | POA: Diagnosis not present

## 2015-09-27 DIAGNOSIS — M5408 Panniculitis affecting regions of neck and back, sacral and sacrococcygeal region: Secondary | ICD-10-CM | POA: Diagnosis not present

## 2015-09-27 DIAGNOSIS — M9902 Segmental and somatic dysfunction of thoracic region: Secondary | ICD-10-CM | POA: Diagnosis not present

## 2015-09-27 DIAGNOSIS — M9901 Segmental and somatic dysfunction of cervical region: Secondary | ICD-10-CM | POA: Diagnosis not present

## 2015-10-25 DIAGNOSIS — M5408 Panniculitis affecting regions of neck and back, sacral and sacrococcygeal region: Secondary | ICD-10-CM | POA: Diagnosis not present

## 2015-10-25 DIAGNOSIS — M9902 Segmental and somatic dysfunction of thoracic region: Secondary | ICD-10-CM | POA: Diagnosis not present

## 2015-10-25 DIAGNOSIS — M531 Cervicobrachial syndrome: Secondary | ICD-10-CM | POA: Diagnosis not present

## 2015-10-25 DIAGNOSIS — M9901 Segmental and somatic dysfunction of cervical region: Secondary | ICD-10-CM | POA: Diagnosis not present

## 2015-10-26 DIAGNOSIS — M9901 Segmental and somatic dysfunction of cervical region: Secondary | ICD-10-CM | POA: Diagnosis not present

## 2015-10-26 DIAGNOSIS — M531 Cervicobrachial syndrome: Secondary | ICD-10-CM | POA: Diagnosis not present

## 2015-10-26 DIAGNOSIS — M5408 Panniculitis affecting regions of neck and back, sacral and sacrococcygeal region: Secondary | ICD-10-CM | POA: Diagnosis not present

## 2015-10-26 DIAGNOSIS — M9902 Segmental and somatic dysfunction of thoracic region: Secondary | ICD-10-CM | POA: Diagnosis not present

## 2015-11-02 DIAGNOSIS — M9901 Segmental and somatic dysfunction of cervical region: Secondary | ICD-10-CM | POA: Diagnosis not present

## 2015-11-02 DIAGNOSIS — M5408 Panniculitis affecting regions of neck and back, sacral and sacrococcygeal region: Secondary | ICD-10-CM | POA: Diagnosis not present

## 2015-11-02 DIAGNOSIS — M9902 Segmental and somatic dysfunction of thoracic region: Secondary | ICD-10-CM | POA: Diagnosis not present

## 2015-11-02 DIAGNOSIS — M531 Cervicobrachial syndrome: Secondary | ICD-10-CM | POA: Diagnosis not present

## 2015-11-22 DIAGNOSIS — M5408 Panniculitis affecting regions of neck and back, sacral and sacrococcygeal region: Secondary | ICD-10-CM | POA: Diagnosis not present

## 2015-11-22 DIAGNOSIS — M9902 Segmental and somatic dysfunction of thoracic region: Secondary | ICD-10-CM | POA: Diagnosis not present

## 2015-11-22 DIAGNOSIS — M9901 Segmental and somatic dysfunction of cervical region: Secondary | ICD-10-CM | POA: Diagnosis not present

## 2015-11-22 DIAGNOSIS — M531 Cervicobrachial syndrome: Secondary | ICD-10-CM | POA: Diagnosis not present

## 2015-12-17 ENCOUNTER — Other Ambulatory Visit (INDEPENDENT_AMBULATORY_CARE_PROVIDER_SITE_OTHER): Payer: BLUE CROSS/BLUE SHIELD

## 2015-12-17 ENCOUNTER — Telehealth: Payer: Self-pay | Admitting: Gastroenterology

## 2015-12-17 DIAGNOSIS — K50119 Crohn's disease of large intestine with unspecified complications: Secondary | ICD-10-CM | POA: Diagnosis not present

## 2015-12-17 DIAGNOSIS — R197 Diarrhea, unspecified: Secondary | ICD-10-CM

## 2015-12-17 LAB — COMPREHENSIVE METABOLIC PANEL
ALBUMIN: 4.7 g/dL (ref 3.5–5.2)
ALK PHOS: 35 U/L — AB (ref 39–117)
ALT: 13 U/L (ref 0–53)
AST: 14 U/L (ref 0–37)
BILIRUBIN TOTAL: 0.6 mg/dL (ref 0.2–1.2)
BUN: 11 mg/dL (ref 6–23)
CO2: 34 mEq/L — ABNORMAL HIGH (ref 19–32)
CREATININE: 0.98 mg/dL (ref 0.40–1.50)
Calcium: 9.6 mg/dL (ref 8.4–10.5)
Chloride: 101 mEq/L (ref 96–112)
GFR: 94.42 mL/min (ref >60.00)
Glucose, Bld: 120 mg/dL — ABNORMAL HIGH (ref 70–99)
Potassium: 4.8 mEq/L (ref 3.5–5.1)
SODIUM: 138 meq/L (ref 135–145)
TOTAL PROTEIN: 7.1 g/dL (ref 6.0–8.3)

## 2015-12-17 LAB — CBC WITH DIFFERENTIAL/PLATELET
Basophils Absolute: 0 10*3/uL (ref 0.0–0.1)
Basophils Relative: 0.4 % (ref 0.0–3.0)
EOS ABS: 0.2 10*3/uL (ref 0.0–0.7)
EOS PCT: 2.8 % (ref 0.0–5.0)
HEMATOCRIT: 43.8 % (ref 39.0–52.0)
HEMOGLOBIN: 15.1 g/dL (ref 13.0–17.0)
LYMPHS PCT: 31.7 % (ref 12.0–46.0)
Lymphs Abs: 2.2 10*3/uL (ref 0.7–4.0)
MCHC: 34.4 g/dL (ref 30.0–36.0)
MCV: 85.1 fl (ref 78.0–100.0)
MONO ABS: 0.5 10*3/uL (ref 0.1–1.0)
Monocytes Relative: 7.2 % (ref 3.0–12.0)
Neutro Abs: 4.1 10*3/uL (ref 1.4–7.7)
Neutrophils Relative %: 57.9 % (ref 43.0–77.0)
Platelets: 300 10*3/uL (ref 150.0–400.0)
RBC: 5.15 Mil/uL (ref 4.22–5.81)
RDW: 12.9 % (ref 11.5–15.5)
WBC: 7.1 10*3/uL (ref 4.0–10.5)

## 2015-12-17 LAB — SEDIMENTATION RATE: SED RATE: 2 mm/h (ref 0–15)

## 2015-12-17 NOTE — Telephone Encounter (Signed)
Pt has been made aware and will have labs and stool test today.  He will try imodium after the stool sample

## 2015-12-17 NOTE — Telephone Encounter (Signed)
Lets get labs: cbc, cmet, esr.  Stool tests c. Difficile, routine culture.  Tell him to try imodium one or two per day for now (unless he has signficant abd pains).  Pending those results he may need CT scan.   Has been off crohn's meds for 1-2 years now.

## 2015-12-17 NOTE — Telephone Encounter (Signed)
Pt was doing great but had greek food last week and got food poisoning, has not had an appetite until yesterday.  When he ate dinner he began to have diarrhea Almost immediately.  Lost 10 pounds since last Friday. Down to 181 pounds.  Feels like he is having a crohn's flare.  Should he been seen or can you send antibiotic?  Please advise

## 2015-12-20 ENCOUNTER — Other Ambulatory Visit: Payer: BLUE CROSS/BLUE SHIELD

## 2015-12-20 ENCOUNTER — Other Ambulatory Visit: Payer: Self-pay

## 2015-12-20 DIAGNOSIS — R1031 Right lower quadrant pain: Secondary | ICD-10-CM

## 2015-12-20 DIAGNOSIS — K50119 Crohn's disease of large intestine with unspecified complications: Secondary | ICD-10-CM | POA: Diagnosis not present

## 2015-12-20 DIAGNOSIS — R197 Diarrhea, unspecified: Secondary | ICD-10-CM

## 2015-12-21 LAB — CLOSTRIDIUM DIFFICILE BY PCR: Toxigenic C. Difficile by PCR: NOT DETECTED

## 2015-12-24 ENCOUNTER — Ambulatory Visit (INDEPENDENT_AMBULATORY_CARE_PROVIDER_SITE_OTHER)
Admission: RE | Admit: 2015-12-24 | Discharge: 2015-12-24 | Disposition: A | Discharge status: Home / Self Care | Payer: BLUE CROSS/BLUE SHIELD | Source: Ambulatory Visit | Attending: Gastroenterology | Admitting: Gastroenterology

## 2015-12-24 DIAGNOSIS — K50119 Crohn's disease of large intestine with unspecified complications: Secondary | ICD-10-CM | POA: Diagnosis not present

## 2015-12-24 DIAGNOSIS — K5010 Crohn's disease of large intestine without complications: Secondary | ICD-10-CM

## 2015-12-24 DIAGNOSIS — R1031 Right lower quadrant pain: Secondary | ICD-10-CM | POA: Diagnosis not present

## 2015-12-24 LAB — STOOL CULTURE

## 2015-12-24 MED ORDER — IOPAMIDOL (ISOVUE-300) INJECTION 61%
100.0000 mL | Freq: Once | INTRAVENOUS | Status: AC | PRN
  Administered 2015-12-24: 80 mL via INTRAVENOUS

## 2015-12-27 DIAGNOSIS — M5408 Panniculitis affecting regions of neck and back, sacral and sacrococcygeal region: Secondary | ICD-10-CM | POA: Diagnosis not present

## 2015-12-27 DIAGNOSIS — M9902 Segmental and somatic dysfunction of thoracic region: Secondary | ICD-10-CM | POA: Diagnosis not present

## 2015-12-27 DIAGNOSIS — M531 Cervicobrachial syndrome: Secondary | ICD-10-CM | POA: Diagnosis not present

## 2015-12-27 DIAGNOSIS — M9901 Segmental and somatic dysfunction of cervical region: Secondary | ICD-10-CM | POA: Diagnosis not present

## 2015-12-30 ENCOUNTER — Ambulatory Visit: Payer: BLUE CROSS/BLUE SHIELD | Admitting: Physician Assistant

## 2016-01-20 DIAGNOSIS — M531 Cervicobrachial syndrome: Secondary | ICD-10-CM | POA: Diagnosis not present

## 2016-01-20 DIAGNOSIS — M9901 Segmental and somatic dysfunction of cervical region: Secondary | ICD-10-CM | POA: Diagnosis not present

## 2016-01-20 DIAGNOSIS — M9902 Segmental and somatic dysfunction of thoracic region: Secondary | ICD-10-CM | POA: Diagnosis not present

## 2016-01-20 DIAGNOSIS — M5408 Panniculitis affecting regions of neck and back, sacral and sacrococcygeal region: Secondary | ICD-10-CM | POA: Diagnosis not present

## 2016-02-01 DIAGNOSIS — M5408 Panniculitis affecting regions of neck and back, sacral and sacrococcygeal region: Secondary | ICD-10-CM | POA: Diagnosis not present

## 2016-02-01 DIAGNOSIS — M9901 Segmental and somatic dysfunction of cervical region: Secondary | ICD-10-CM | POA: Diagnosis not present

## 2016-02-01 DIAGNOSIS — M9902 Segmental and somatic dysfunction of thoracic region: Secondary | ICD-10-CM | POA: Diagnosis not present

## 2016-02-01 DIAGNOSIS — M531 Cervicobrachial syndrome: Secondary | ICD-10-CM | POA: Diagnosis not present

## 2016-02-15 DIAGNOSIS — M5408 Panniculitis affecting regions of neck and back, sacral and sacrococcygeal region: Secondary | ICD-10-CM | POA: Diagnosis not present

## 2016-02-15 DIAGNOSIS — M531 Cervicobrachial syndrome: Secondary | ICD-10-CM | POA: Diagnosis not present

## 2016-02-15 DIAGNOSIS — M9902 Segmental and somatic dysfunction of thoracic region: Secondary | ICD-10-CM | POA: Diagnosis not present

## 2016-02-15 DIAGNOSIS — M9901 Segmental and somatic dysfunction of cervical region: Secondary | ICD-10-CM | POA: Diagnosis not present

## 2016-03-06 DIAGNOSIS — M5408 Panniculitis affecting regions of neck and back, sacral and sacrococcygeal region: Secondary | ICD-10-CM | POA: Diagnosis not present

## 2016-03-06 DIAGNOSIS — M9902 Segmental and somatic dysfunction of thoracic region: Secondary | ICD-10-CM | POA: Diagnosis not present

## 2016-03-06 DIAGNOSIS — M9901 Segmental and somatic dysfunction of cervical region: Secondary | ICD-10-CM | POA: Diagnosis not present

## 2016-03-06 DIAGNOSIS — M531 Cervicobrachial syndrome: Secondary | ICD-10-CM | POA: Diagnosis not present

## 2016-03-30 DIAGNOSIS — M5408 Panniculitis affecting regions of neck and back, sacral and sacrococcygeal region: Secondary | ICD-10-CM | POA: Diagnosis not present

## 2016-03-30 DIAGNOSIS — M9901 Segmental and somatic dysfunction of cervical region: Secondary | ICD-10-CM | POA: Diagnosis not present

## 2016-03-30 DIAGNOSIS — M9902 Segmental and somatic dysfunction of thoracic region: Secondary | ICD-10-CM | POA: Diagnosis not present

## 2016-03-30 DIAGNOSIS — M531 Cervicobrachial syndrome: Secondary | ICD-10-CM | POA: Diagnosis not present

## 2016-04-05 DIAGNOSIS — M5408 Panniculitis affecting regions of neck and back, sacral and sacrococcygeal region: Secondary | ICD-10-CM | POA: Diagnosis not present

## 2016-04-05 DIAGNOSIS — M9902 Segmental and somatic dysfunction of thoracic region: Secondary | ICD-10-CM | POA: Diagnosis not present

## 2016-04-05 DIAGNOSIS — M531 Cervicobrachial syndrome: Secondary | ICD-10-CM | POA: Diagnosis not present

## 2016-04-05 DIAGNOSIS — M9901 Segmental and somatic dysfunction of cervical region: Secondary | ICD-10-CM | POA: Diagnosis not present

## 2016-04-12 ENCOUNTER — Ambulatory Visit (INDEPENDENT_AMBULATORY_CARE_PROVIDER_SITE_OTHER): Payer: BLUE CROSS/BLUE SHIELD | Admitting: Family Medicine

## 2016-04-12 ENCOUNTER — Encounter: Payer: Self-pay | Admitting: Family Medicine

## 2016-04-12 VITALS — BP 120/80 | HR 67 | Temp 98.7°F | Ht 71.5 in | Wt 186.0 lb

## 2016-04-12 DIAGNOSIS — J0100 Acute maxillary sinusitis, unspecified: Secondary | ICD-10-CM | POA: Diagnosis not present

## 2016-04-12 DIAGNOSIS — J029 Acute pharyngitis, unspecified: Secondary | ICD-10-CM

## 2016-04-12 LAB — POCT RAPID STREP A (OFFICE): RAPID STREP A SCREEN: NEGATIVE

## 2016-04-12 MED ORDER — HYDROCODONE-HOMATROPINE 5-1.5 MG/5ML PO SYRP: ORAL_SOLUTION | ORAL | 0 refills | Status: DC

## 2016-04-12 MED ORDER — AZITHROMYCIN 250 MG PO TABS: ORAL_TABLET | ORAL | 0 refills | Status: DC

## 2016-04-12 NOTE — Progress Notes (Signed)
Pre visit review using our clinic review tool, if applicable. No additional management support is needed unless otherwise documented below in the visit note. 

## 2016-04-12 NOTE — Progress Notes (Signed)
Dr. Frederico Hamman T. Sota Hetz, MD, [facility] [place], [address] Phone: [phone] Fax: [phone]  04/12/2016  Patient: Russell [name], MRN: [id number], DOB: 12/02/1985, 30 y.o.  Primary Physician:  Owens Loffler, MD   Chief Complaint  Patient presents with  . Sore Throat  . Sinusitis   Subjective:   This 30 y.o. male patient presents with runny nose, sneezing, cough, sore throat, malaise and minimal / low-grade fever for > 4 week. Now the primary complaint has become sinus pressure and pain behind the eyes and in the upper, anterior face.   Sinus sx for 4 weeks. Son with strep.  Coughing all night.  Woke up and came.   Denies sthortness of breath/wheezing, high fever, chest pain, significant myalgia, otalgia, abdominal pain, changes in bowel or bladder.  PMH, PHS, Allergies, Problem List, Medications, Family History, and Social History have all been reviewed.  Patient Active Problem List   Diagnosis Date Noted  . Scrotal pain 08/19/2015  . Nasal sinus congestion 02/18/2015  . [facility] INTESTINE 05/26/2010  . IRRITABLE BOWEL SYNDROME 09/25/2007    Past Medical History:  Diagnosis Date  . Crohn's disease ([place])   . Irritable bowel syndrome   . 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 COLONOSCOPY  7/11   Dr. Ardis Hughs  . TONSILLECTOMY AND ADENOIDECTOMY  1990's    Social History   Social History  . Marital status: Married    Spouse name: Danae Chen  . Number of children: 2  . Years of education: Masters   Occupational History  . Pastor    Social History Main Topics  . Smoking status: Never Smoker  . Smokeless tobacco: Never Used  . Alcohol use No  . Drug use: No  . Sexual activity: Not on file   Other Topics Concern  . Not on file   Social History Narrative   Married      1 son      Youth  minister; [employer]          Family History  Problem Relation Age of Onset  . Healthy Father   . Healthy Mother   . Crohn's disease Cousin   . Coronary artery disease Neg Hx   . Diabetes Neg Hx   . Hypertension      family hx  . Colon cancer Maternal Grandfather   . Colon polyps Maternal Grandfather     Allergies  Allergen Reactions  . Amoxicillin Rash    Medication list reviewed and updated in full in [place].  ROS as above, eating and drinking - tolerating PO. Urinating normally. No excessive vomitting or diarrhea. O/w as above.  Objective:   Blood pressure 120/80, pulse 67, temperature 98.7 F (37.1 C), temperature source Oral, height 5' 11.5" (1.816 m), weight 186 lb (84.4 kg).  GEN: WDWN, Non-toxic, Atraumatic, normocephalic. A and O x 3. HEENT: Oropharynx clear without exudate, MMM, no significant LAD, mild rhinnorhea Sinuses: Right Frontal, ethmoid, and maxillary: Tender Left Frontal, Ethmoid, and maxillary: Tender Ears: TM clear, COL visualized with good landmarks CV: RRR, no m/g/r. Pulm: CTA B, no wheezes, rhonchi, or crackles, normal respiratory effort. EXT: no c/c/e Psych: well oriented, neither depressed nor anxious in appearance  Assessment and Plan:   Acute maxillary sinusitis, recurrence not specified  Sore throat - Plan: POCT rapid strep A  Acute sinusitis: ABX as below.   Reviewed symptomatic care as well as ABX in this case.  Anti-H and flonase  Follow-up: No Follow-up on file.  New Prescriptions   AZITHROMYCIN (ZITHROMAX) 250 MG TABLET    2 tabs po on day 1, then 1 tab po for 4 days   HYDROCODONE-HOMATROPINE (HYCODAN) 5-1.5 MG/5ML SYRUP    1 tsp po at night before bed prn cough   Orders Placed This Encounter  Procedures  . POCT rapid strep A    Signed,  Ruchy Wildrick T. Devina Bezold, MD  Patient's Medications  New Prescriptions   AZITHROMYCIN (ZITHROMAX) 250 MG TABLET    2 tabs po on day 1, then 1 tab po for 4 days    HYDROCODONE-HOMATROPINE (HYCODAN) 5-1.5 MG/5ML SYRUP    1 tsp po at night before bed prn cough  Previous Medications   No medications on file  Modified Medications   No medications on file  Discontinued Medications   No medications on file

## 2016-04-20 DIAGNOSIS — M9902 Segmental and somatic dysfunction of thoracic region: Secondary | ICD-10-CM | POA: Diagnosis not present

## 2016-04-20 DIAGNOSIS — M5408 Panniculitis affecting regions of neck and back, sacral and sacrococcygeal region: Secondary | ICD-10-CM | POA: Diagnosis not present

## 2016-04-20 DIAGNOSIS — M531 Cervicobrachial syndrome: Secondary | ICD-10-CM | POA: Diagnosis not present

## 2016-04-20 DIAGNOSIS — M9901 Segmental and somatic dysfunction of cervical region: Secondary | ICD-10-CM | POA: Diagnosis not present

## 2016-05-09 DIAGNOSIS — M9902 Segmental and somatic dysfunction of thoracic region: Secondary | ICD-10-CM | POA: Diagnosis not present

## 2016-05-09 DIAGNOSIS — M5408 Panniculitis affecting regions of neck and back, sacral and sacrococcygeal region: Secondary | ICD-10-CM | POA: Diagnosis not present

## 2016-05-09 DIAGNOSIS — M9901 Segmental and somatic dysfunction of cervical region: Secondary | ICD-10-CM | POA: Diagnosis not present

## 2016-05-09 DIAGNOSIS — M531 Cervicobrachial syndrome: Secondary | ICD-10-CM | POA: Diagnosis not present

## 2016-05-15 DIAGNOSIS — M9902 Segmental and somatic dysfunction of thoracic region: Secondary | ICD-10-CM | POA: Diagnosis not present

## 2016-05-15 DIAGNOSIS — M9901 Segmental and somatic dysfunction of cervical region: Secondary | ICD-10-CM | POA: Diagnosis not present

## 2016-05-15 DIAGNOSIS — M531 Cervicobrachial syndrome: Secondary | ICD-10-CM | POA: Diagnosis not present

## 2016-05-15 DIAGNOSIS — M5408 Panniculitis affecting regions of neck and back, sacral and sacrococcygeal region: Secondary | ICD-10-CM | POA: Diagnosis not present

## 2016-05-16 DIAGNOSIS — M531 Cervicobrachial syndrome: Secondary | ICD-10-CM | POA: Diagnosis not present

## 2016-05-16 DIAGNOSIS — M5408 Panniculitis affecting regions of neck and back, sacral and sacrococcygeal region: Secondary | ICD-10-CM | POA: Diagnosis not present

## 2016-05-16 DIAGNOSIS — M9902 Segmental and somatic dysfunction of thoracic region: Secondary | ICD-10-CM | POA: Diagnosis not present

## 2016-05-16 DIAGNOSIS — M9901 Segmental and somatic dysfunction of cervical region: Secondary | ICD-10-CM | POA: Diagnosis not present

## 2016-05-30 DIAGNOSIS — M9901 Segmental and somatic dysfunction of cervical region: Secondary | ICD-10-CM | POA: Diagnosis not present

## 2016-05-30 DIAGNOSIS — M5408 Panniculitis affecting regions of neck and back, sacral and sacrococcygeal region: Secondary | ICD-10-CM | POA: Diagnosis not present

## 2016-05-30 DIAGNOSIS — M531 Cervicobrachial syndrome: Secondary | ICD-10-CM | POA: Diagnosis not present

## 2016-05-30 DIAGNOSIS — M9902 Segmental and somatic dysfunction of thoracic region: Secondary | ICD-10-CM | POA: Diagnosis not present

## 2016-06-19 ENCOUNTER — Encounter: Payer: Self-pay | Admitting: Primary Care

## 2016-06-19 ENCOUNTER — Ambulatory Visit (INDEPENDENT_AMBULATORY_CARE_PROVIDER_SITE_OTHER): Payer: BLUE CROSS/BLUE SHIELD | Admitting: Primary Care

## 2016-06-19 VITALS — BP 138/92 | HR 76 | Temp 98.4°F | Wt 187.0 lb

## 2016-06-19 DIAGNOSIS — J3489 Other specified disorders of nose and nasal sinuses: Secondary | ICD-10-CM

## 2016-06-19 MED ORDER — PREDNISONE 10 MG PO TABS: ORAL_TABLET | ORAL | 0 refills | Status: DC

## 2016-06-19 NOTE — Progress Notes (Signed)
Subjective:    Patient ID: Russell [name], male    DOB: [birth year], 31 y.o.   MRN: [id number]  HPI  Russell [name] is a 31 year old male who presents today with a chief complaint of nasal congestion. He also reports cough, sinus pressure, headache, sore throat. He denies shortness of breath, wheezing, esophageal burning, epigastric pain, fevers. His cough is worse at night.   He was evaluated and treated by PCP on 04-12-16, and was prescribed Azithromycin and Hycodan. He never feels as though his symptoms from November ever resolved. He feels no different from that visit.  He's been taking tylenol cold and sinus and Zyrtec with temporary improvement. His cough is productive with yellow sputum that is intermittent. He doesn't feel sick but doesn't feel well.   Review of Systems  Constitutional: Positive for fatigue. Negative for chills and fever.  HENT: Positive for congestion, sinus pain, sinus pressure and sore throat. Negative for ear pain.   Respiratory: Positive for cough. Negative for shortness of breath and wheezing.        Past Medical History:  Diagnosis Date  . Crohn's disease ([place])   . Irritable bowel syndrome   . 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 Marital status: Married    Spouse name: Russell [name]  . Number of children: 2  . Years of education: Masters   Occupational History  . Pastor    Social History Main Topics  . Smoking status: Never Smoker  . Smokeless tobacco: Never Used  . Alcohol use No  . Drug use: No  . Sexual activity: Not on file   Other Topics Concern  . Not on file   Social History Narrative   Married      1 son      Youth minister; Psychologist, counselling          Past Surgical History:  Procedure Laterality Date  . COLONOSCOPY  7/11   Dr. Ardis Hughs  . TONSILLECTOMY AND ADENOIDECTOMY  1990's    Family History  Problem Relation Age of Onset  . Healthy Father   .  Healthy Mother   . Crohn's disease Cousin   . Coronary artery disease Neg Hx   . Diabetes Neg Hx   . Hypertension      family hx  . Colon cancer Maternal Grandfather   . Colon polyps Maternal Grandfather     Allergies  Allergen Reactions  . Amoxicillin Rash    No current outpatient prescriptions on file prior to visit.   No current facility-administered medications on file prior to visit.     BP (!) 138/92 (BP Location: Left Arm, Patient Position: Sitting, Cuff Size: Normal)   Pulse 76   Temp 98.4 F (36.9 C) (Oral)   Wt 187 lb (84.8 kg)   SpO2 94%   BMI 25.72 kg/m    Objective:   Physical Exam  Constitutional: He appears well-nourished.  HENT:  Right Ear: Ear canal normal. Tympanic membrane is bulging. Tympanic membrane is not erythematous.  Left Ear: Tympanic membrane and ear canal normal.  Nose: Mucosal edema present. Right sinus exhibits maxillary sinus tenderness and frontal sinus tenderness. Left sinus exhibits maxillary sinus tenderness and frontal sinus tenderness.  Mouth/Throat: Oropharynx is clear and moist.  Eyes: Conjunctivae are normal.  Neck: Neck supple.  Cardiovascular: Normal rate and regular rhythm.   Pulmonary/Chest: Effort normal and breath sounds normal.  He has no wheezes. He has no rales.  Skin: Skin is warm and dry.          Assessment & Plan:  Chronic Sinusitis:  Sinus pressure, cough, headache x 2 months. Cough today doesn't seem to be related to GERD or asthma, and his crohn's is well controlled. No wheezing on exam, in fact, lungs clear. He doesn't appear acutely ill. Do not suspect bacterial involvement at this point. Rx for low dose prednisone taper sent to pharmacy. Discussed to use Flonase once prescription completed. Continue Zyrtec. Follow up if no improvement.  Sheral Flow, NP

## 2016-06-19 NOTE — Patient Instructions (Addendum)
Your symptoms are related to long term sinus congestion/allergies.  Start prednisone tablets. Take three tablets for 2 days, then two tablets for 2 days, then one tablet for 2 days.  Once you've completed the prednisone, start using Flonase (fluticasone) nasal spray. Instill 1 spray in each nostril twice daily.   Continue taking Zyrtec every evening at bedtime.  It was a pleasure to see you today!

## 2016-06-19 NOTE — Progress Notes (Signed)
Pre visit review using our clinic review tool, if applicable. No additional management support is needed unless otherwise documented below in the visit note. 

## 2016-07-25 DIAGNOSIS — J329 Chronic sinusitis, unspecified: Secondary | ICD-10-CM | POA: Diagnosis not present

## 2016-08-01 DIAGNOSIS — M9901 Segmental and somatic dysfunction of cervical region: Secondary | ICD-10-CM | POA: Diagnosis not present

## 2016-08-01 DIAGNOSIS — M9902 Segmental and somatic dysfunction of thoracic region: Secondary | ICD-10-CM | POA: Diagnosis not present

## 2016-08-01 DIAGNOSIS — M5408 Panniculitis affecting regions of neck and back, sacral and sacrococcygeal region: Secondary | ICD-10-CM | POA: Diagnosis not present

## 2016-08-01 DIAGNOSIS — M531 Cervicobrachial syndrome: Secondary | ICD-10-CM | POA: Diagnosis not present

## 2016-08-26 ENCOUNTER — Telehealth: Payer: BLUE CROSS/BLUE SHIELD | Admitting: Physician Assistant

## 2016-08-26 DIAGNOSIS — J019 Acute sinusitis, unspecified: Secondary | ICD-10-CM

## 2016-08-26 DIAGNOSIS — B9689 Other specified bacterial agents as the cause of diseases classified elsewhere: Secondary | ICD-10-CM

## 2016-08-26 MED ORDER — DOXYCYCLINE HYCLATE 100 MG PO CAPS: 100.0000 mg | ORAL_CAPSULE | Freq: Two times a day (BID) | ORAL | 0 refills | Status: DC

## 2016-08-26 NOTE — Progress Notes (Signed)

## 2016-08-29 DIAGNOSIS — J329 Chronic sinusitis, unspecified: Secondary | ICD-10-CM | POA: Diagnosis not present

## 2016-10-11 DIAGNOSIS — J0100 Acute maxillary sinusitis, unspecified: Secondary | ICD-10-CM | POA: Diagnosis not present

## 2016-10-24 ENCOUNTER — Telehealth: Payer: Self-pay | Admitting: Family Medicine

## 2016-10-24 DIAGNOSIS — Z1329 Encounter for screening for other suspected endocrine disorder: Secondary | ICD-10-CM

## 2016-10-24 DIAGNOSIS — Z130 Encounter for screening for diseases of the blood and blood-forming organs and certain disorders involving the immune mechanism: Secondary | ICD-10-CM

## 2016-10-24 DIAGNOSIS — Z13220 Encounter for screening for lipoid disorders: Secondary | ICD-10-CM

## 2016-10-24 NOTE — Telephone Encounter (Signed)
-----   Message from Ellamae Sia sent at 10/18/2016  6:31 PM EDT ----- Regarding: Lab orders for Wednesday,5.16.18 Patient is scheduled for CPX labs, please order future labs, Thanks , Karna Christmas

## 2016-10-26 ENCOUNTER — Other Ambulatory Visit (INDEPENDENT_AMBULATORY_CARE_PROVIDER_SITE_OTHER): Payer: BLUE CROSS/BLUE SHIELD

## 2016-10-26 DIAGNOSIS — Z1329 Encounter for screening for other suspected endocrine disorder: Secondary | ICD-10-CM

## 2016-10-26 DIAGNOSIS — Z130 Encounter for screening for diseases of the blood and blood-forming organs and certain disorders involving the immune mechanism: Secondary | ICD-10-CM | POA: Diagnosis not present

## 2016-10-26 DIAGNOSIS — Z13220 Encounter for screening for lipoid disorders: Secondary | ICD-10-CM | POA: Diagnosis not present

## 2016-10-26 LAB — COMPREHENSIVE METABOLIC PANEL
ALBUMIN: 4.8 g/dL (ref 3.5–5.2)
ALK PHOS: 31 U/L — AB (ref 39–117)
ALT: 18 U/L (ref 0–53)
AST: 15 U/L (ref 0–37)
BUN: 14 mg/dL (ref 6–23)
CHLORIDE: 103 meq/L (ref 96–112)
CO2: 33 mEq/L — ABNORMAL HIGH (ref 19–32)
Calcium: 9.7 mg/dL (ref 8.4–10.5)
Creatinine, Ser: 0.96 mg/dL (ref 0.40–1.50)
GFR: 96.17 mL/min (ref >60.00)
Glucose, Bld: 92 mg/dL (ref 70–99)
POTASSIUM: 4.8 meq/L (ref 3.5–5.1)
SODIUM: 141 meq/L (ref 135–145)
TOTAL PROTEIN: 7.1 g/dL (ref 6.0–8.3)
Total Bilirubin: 0.6 mg/dL (ref 0.2–1.2)

## 2016-10-26 LAB — LIPID PANEL
CHOLESTEROL: 169 mg/dL (ref 0–200)
HDL: 46.3 mg/dL (ref >39.00)
LDL CALC: 111 mg/dL — AB (ref 0–99)
NonHDL: 122.86
Total CHOL/HDL Ratio: 4
Triglycerides: 57 mg/dL (ref 0.0–149.0)
VLDL: 11.4 mg/dL (ref 0.0–40.0)

## 2016-10-26 LAB — CBC WITH DIFFERENTIAL/PLATELET
BASOS ABS: 0.1 10*3/uL (ref 0.0–0.1)
Basophils Relative: 0.7 % (ref 0.0–3.0)
EOS PCT: 2.6 % (ref 0.0–5.0)
Eosinophils Absolute: 0.2 10*3/uL (ref 0.0–0.7)
HEMATOCRIT: 44.4 % (ref 39.0–52.0)
HEMOGLOBIN: 15.2 g/dL (ref 13.0–17.0)
LYMPHS PCT: 27.6 % (ref 12.0–46.0)
Lymphs Abs: 2.1 10*3/uL (ref 0.7–4.0)
MCHC: 34.2 g/dL (ref 30.0–36.0)
MCV: 85.9 fl (ref 78.0–100.0)
MONOS PCT: 6.5 % (ref 3.0–12.0)
Monocytes Absolute: 0.5 10*3/uL (ref 0.1–1.0)
Neutro Abs: 4.8 10*3/uL (ref 1.4–7.7)
Neutrophils Relative %: 62.6 % (ref 43.0–77.0)
Platelets: 259 10*3/uL (ref 150.0–400.0)
RBC: 5.17 Mil/uL (ref 4.22–5.81)
RDW: 13.2 % (ref 11.5–15.5)
WBC: 7.7 10*3/uL (ref 4.0–10.5)

## 2016-10-26 LAB — TSH: TSH: 1.07 u[IU]/mL (ref 0.35–4.50)

## 2016-10-30 ENCOUNTER — Encounter: Payer: Self-pay | Admitting: Family Medicine

## 2016-11-02 ENCOUNTER — Encounter: Payer: BLUE CROSS/BLUE SHIELD | Admitting: Family Medicine

## 2016-11-02 ENCOUNTER — Ambulatory Visit (INDEPENDENT_AMBULATORY_CARE_PROVIDER_SITE_OTHER): Payer: BLUE CROSS/BLUE SHIELD | Admitting: Family Medicine

## 2016-11-02 ENCOUNTER — Encounter: Payer: Self-pay | Admitting: Family Medicine

## 2016-11-02 VITALS — BP 110/80 | HR 62 | Temp 98.5°F | Ht 71.5 in | Wt 182.0 lb

## 2016-11-02 DIAGNOSIS — J0101 Acute recurrent maxillary sinusitis: Secondary | ICD-10-CM | POA: Diagnosis not present

## 2016-11-02 DIAGNOSIS — Z0001 Encounter for general adult medical examination with abnormal findings: Secondary | ICD-10-CM

## 2016-11-02 DIAGNOSIS — Z8249 Family history of ischemic heart disease and other diseases of the circulatory system: Secondary | ICD-10-CM

## 2016-11-02 DIAGNOSIS — F411 Generalized anxiety disorder: Secondary | ICD-10-CM | POA: Diagnosis not present

## 2016-11-02 DIAGNOSIS — R079 Chest pain, unspecified: Secondary | ICD-10-CM | POA: Diagnosis not present

## 2016-11-02 DIAGNOSIS — Z0000 Encounter for general adult medical examination without abnormal findings: Secondary | ICD-10-CM

## 2016-11-02 MED ORDER — LEVOFLOXACIN 500 MG PO TABS: 500.0000 mg | ORAL_TABLET | Freq: Every day | ORAL | 0 refills | Status: DC

## 2016-11-02 MED ORDER — PAROXETINE HCL 20 MG PO TABS: 20.0000 mg | ORAL_TABLET | Freq: Every day | ORAL | 2 refills | Status: DC

## 2016-11-02 MED ORDER — ESZOPICLONE 3 MG PO TABS: 3.0000 mg | ORAL_TABLET | Freq: Every day | ORAL | 0 refills | Status: DC

## 2016-11-02 NOTE — Progress Notes (Signed)
_0  Subjective:   Russell [name] is a 31 y.o. pleasant patient who presents with the following:  Health Maintenance Summary Reviewed and updated, unless pt declines services.  Tobacco History Reviewed. Non-smoker Alcohol: No concerns, no excessive use Exercise Habits: Some activity, rec at least 30 mins 5 times a week STD concerns: none Drug Use: None  Tdap  Continually sick now for about 6 weeks, about 5 weeks ago Zpak.   Burned out some. Not sleeping at niht. Stays tired a lot.  Not sleeping - brain not shutting off.  No anhedonia.  Not sleeping - not much at all.  Benadryl - can make a little bit jump.   Father - going to have surgery on a leaky valve now and upcoming.    Health Maintenance  Topic Date Due  . HIV Screening  04/25/1999  . TETANUS/TDAP  10/02/2010  . INFLUENZA VACCINE  01/10/2017     Immunization History  Administered Date(s) Administered  . Td 10/01/2000   Patient Active Problem List   Diagnosis Date Noted  . [facility] INTESTINE 05/26/2010  . IRRITABLE BOWEL SYNDROME 09/25/2007   Past Medical History:  Diagnosis Date  . Crohn's disease ([place])   . Irritable bowel syndrome   . Regional enteritis of small intestine ([place]) 7/11   Crohn's ileitis   Past Surgical History:  Procedure Laterality Date  . COLONOSCOPY  7/11   Dr. Ardis Hughs  . TONSILLECTOMY AND ADENOIDECTOMY  1990's   Social History   Social History  . Marital status: Married    Spouse name: Danae Chen  . Number of children: 2  . Years of education: Masters   Occupational History  . Pastor    Social History Main Topics  . Smoking status: Never Smoker  . Smokeless tobacco: Never Used  . Alcohol use No  . Drug use: No  . Sexual activity: Not on file   Other Topics Concern  . Not on file   Social History Narrative   Married      1 son      Youth minister; [employer]         Family History  Problem Relation Age of Onset  . Healthy Father   .  Healthy Mother   . Crohn's disease Cousin   . Coronary artery disease Neg Hx   . Diabetes Neg Hx   . Hypertension Unknown        family hx  . Colon cancer Maternal Grandfather   . Colon polyps Maternal Grandfather    Allergies  Allergen Reactions  . Amoxicillin Rash    Medication list has been reviewed and updated.   General: Denies fever, chills, sweats. No significant weight loss. Eyes: Denies blurring,significant itching ENT: Max sinus pain Cardiovascular: CP, no SOB Respiratory: Denies cough, dyspnea at rest,wheeezing Breast: no concerns about lumps GI: Denies nausea, vomiting, diarrhea, constipation, change in bowel habits, abdominal pain, melena, hematochezia GU: Denies dysuria, hematuria, urinary hesitancy, nocturia, denies STD risk, no concerns about discharge Musculoskeletal: Denies back pain, joint pain Derm: Denies rash, itching Neuro: Denies  paresthesias, frequent falls, frequent headaches Psych: Anxiety, insomnia Endocrine: Denies cold intolerance, heat intolerance, polydipsia Heme: Denies enlarged lymph nodes Allergy: No hayfever  Objective:   BP 110/80   Pulse 62   Temp 98.5 F (36.9 C) (Oral)   Ht 5' 11.5" (1.816 m)   Wt 182 lb (82.6 kg)   BMI 25.03 kg/m  No exam data present  GEN: well developed, well nourished, no  acute distress Eyes: conjunctiva and lids normal, PERRLA, EOMI ENT: TM clear, nares clear, oral exam WNL Neck: supple, no lymphadenopathy, no thyromegaly, no JVD Pulm: clear to auscultation and percussion, respiratory effort normal CV: regular rate and rhythm, S1-S2, no murmur, rub or gallop, no bruits Chest: no scars, masses, no lumps GI: soft, non-tender; no hepatosplenomegaly, masses; active bowel sounds all quadrants Lymph: no cervical, axillary or inguinal adenopathy MSK: gait normal, muscle tone and strength WNL, no joint swelling, effusions, discoloration, crepitus  SKIN: clear, good turgor, color WNL, no rashes, lesions, or  ulcerations Neuro: normal mental status, normal strength, sensation, and motion Psych: alert; oriented to person, place and time. Energetic.  All labs reviewed with patient. Lipids:    Component Value Date/Time   CHOL 169 10/26/2016 0900   TRIG 57.0 10/26/2016 0900   HDL 46.30 10/26/2016 0900   VLDL 11.4 10/26/2016 0900   CHOLHDL 4 10/26/2016 0900   CBC: CBC Latest Ref Rng & Units 10/26/2016 12/17/2015 05/14/2014  WBC 4.0 - 10.5 K/uL 7.7 7.1 7.8  Hemoglobin 13.0 - 17.0 g/dL 15.2 15.1 14.7  Hematocrit 39.0 - 52.0 % 44.4 43.8 43.8  Platelets 150.0 - 400.0 K/uL 259.0 300.0 737.1    Basic Metabolic Panel:    Component Value Date/Time   NA 141 10/26/2016 0900   K 4.8 10/26/2016 0900   CL 103 10/26/2016 0900   CO2 33 (H) 10/26/2016 0900   BUN 14 10/26/2016 0900   CREATININE 0.96 10/26/2016 0900   GLUCOSE 92 10/26/2016 0900   CALCIUM 9.7 10/26/2016 0900   Hepatic Function Latest Ref Rng & Units 10/26/2016 12/17/2015 05/14/2014  Total Protein 6.0 - 8.3 g/dL 7.1 7.1 7.0  Albumin 3.5 - 5.2 g/dL 4.8 4.7 4.6  AST 0 - 37 U/L _0 ALT 0 - 53 U/L _1 Alk Phosphatase 39 - 117 U/L 31(L) 35(L) 37(L)  Total Bilirubin 0.2 - 1.2 mg/dL 0.6 0.6 0.9  Bilirubin, Direct 0.0 - 0.3 mg/dL - - -    Lab Results  Component Value Date   TSH 1.07 10/26/2016   No results found.  Assessment and Plan:   Healthcare maintenance  Intermittent chest pain - Plan: Ambulatory referral to Cardiology F Hx of cardiac disease, valvular disorder - he is very concerned about this.  Not unreasonable to further eval given rare CP, also.   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 Brought  up to date unless services declined.  Counselled on the importance of diet, exercise, and its role in overall health and mortality. The patient's FH and SH was reviewed, including their home life, tobacco status, and drug and alcohol status.  Follow-up in 1 year for physical exam or additional follow-up below.  Follow-up: Return in about 6 weeks (around 12/14/2016). Or follow-up in 1 year if not noted.  Meds ordered this encounter  Medications  . levofloxacin (LEVAQUIN) 500 MG tablet    Sig: Take 1 tablet (500 mg total) by mouth daily.    Dispense:  21 tablet    Refill:  0  . PARoxetine (PAXIL) 20 MG tablet    Sig: Take 1 tablet (20 mg total) by mouth daily.    Dispense:  30 tablet    Refill:  2  . Eszopiclone 3 MG TABS    Sig: Take 1 tablet (3 mg total) by mouth at bedtime. Take immediately before bedtime    Dispense:  30 tablet    Refill:  0   Medications Discontinued During This Encounter  Medication Reason  . predniSONE (DELTASONE) 10 MG tablet Completed Course  . doxycycline (VIBRAMYCIN) 100 MG capsule Completed Course  . Multiple Vitamin (MULTIVITAMIN) tablet Completed Course   Orders Placed This Encounter  Procedures  . Ambulatory referral to Cardiology    Signed,  Frederico Hamman T. Milisa Kimbell, MD   Allergies as of 11/02/2016      Reactions   Amoxicillin Rash      Medication List       Accurate as of 11/02/16 11:59 PM. Always use your most recent med list.          Eszopiclone 3 MG Tabs Take 1 tablet (3 mg total) by mouth at bedtime. Take immediately before bedtime   levofloxacin 500 MG tablet Commonly known as:  LEVAQUIN Take 1 tablet (500 mg total) by mouth daily.   PARoxetine 20 MG tablet Commonly known as:  PAXIL Take 1 tablet (20 mg total) by mouth daily.

## 2016-11-03 ENCOUNTER — Telehealth: Payer: Self-pay

## 2016-11-03 NOTE — Telephone Encounter (Signed)
PLEASE NOTE: All timestamps contained within this report are represented as [place] Standard Time. CONFIDENTIALTY NOTICE: This fax transmission is intended only for the addressee. It contains information that is legally privileged, confidential or otherwise protected from use or disclosure. If you are not the intended recipient, you are strictly prohibited from reviewing, disclosing, copying using or disseminating any of this information or taking any action in reliance on or regarding this information. If you have received this fax in error, please notify us immediately by telephone so that we can arrange for its return to [place]. Phone: [phone], Toll-Free: [phone], Fax: [phone] Page: 1 of 2 Call Id: [id number] [place] Patient Name: Russell [name] Gender: Male DOB: [birth year] Age: 31 Y 68 M 12 D Return Phone Number: [phone] (Primary), [phone] (Secondary) [place] [address] Client [facility] - Client Client Site [facility] Day Physician Copland, Frederico Hamman - MD Who Is Calling Patient / Member / Family / Caregiver Call Type Triage / Clinical Relationship To Patient Self Return Phone Number [phone] (Primary) Chief Complaint Earache Reason for Call Symptomatic / Request for Health Information Initial Comment caller states he has a sinus infection and fluid in his ear, he is on anti biotic but now his ear is throbbing and he would like those numbing ear drops called in. Appointment Disposition EMR Appointment Not Necessary Info pasted into Epic No Nurse Assessment Nurse: Yolanda Bonine, RN, Erin Date/Time (Eastern Time): 11/02/2016 7:32:11 PM Confirm and document reason for call. If symptomatic, describe symptoms. ---caller states was seen by MD today and started ABX but tonight the pain in his Right ear is  terrible. 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 No fever Does the PT have any chronic conditions? (i.e. diabetes, asthma, etc.) ---Yes List chronic conditions. ---Chrons Disease Guidelines Guideline Title Affirmed Question Ear - Otitis Media Follow-up Call [1] SEVERE pain and [2] not improved 2 hours after analgesic medication (e.g., ibuprofen or acetaminophen) Disp. Time Eilene Ghazi Time) Disposition Final User 11/02/2016 7:41:44 PM See Physician within 4 Hours (or PCP triage) Yes Yolanda Bonine, RN, Erin Referrals GO TO FACILITY UNDECIDED Care Advice Given Per Guideline CARE ADVICE given per Ear - Otitis Media Follow-Up Call (Adult) guideline SEE PHYSICIAN WITHIN 4 HOURS (or PCP triage): * IF OFFICE WILL BE CLOSED AND PCP TRIAGE REQUIRED: You may need to be seen. Your doctor will want to talk with you to decide what's best. I'll page the doctor now. If you haven't heard from the on-call doctor within 30 minutes, call again. NOTE: If PCP can't be reached, send to [facility] or ER. PAIN MEDICINES: ACETAMINOPHEN (E.G., TYLENOL): PLEASE NOTE: All timestamps contained within this report are represented as [place] Standard Time. CONFIDENTIALTY NOTICE: This fax transmission is intended only for the addressee. It contains information that is legally privileged, confidential or otherwise protected from use or disclosure. If you are not the intended recipient, you are strictly prohibited from reviewing, disclosing, copying using or disseminating any of this information or taking any action in reliance on or regarding this information. If you have received this fax in error, please notify us immediately by telephone so that we can arrange for its return to [place]. Phone: [phone], Toll-Free: [phone], Fax: [phone] Page: 2 of 2 Call Id: [id number] Comments User: Annetta Maw, RN Date/Time Eilene Ghazi Time): 11/02/2016 7:35:02 PM right ear is ringing and throbing User: Annetta Maw, RN Date/Time Eilene Ghazi  Time): 11/02/2016 7:36:38 PM was in today for physical and happened to mention his sinuses and ears User: Annetta Maw, RN Date/Time Eilene Ghazi Time): 11/02/2016 7:37:16 PM tender behind right ear User: Annetta Maw, RN Date/Time Eilene Ghazi Time): 11/02/2016 7:50:27 PM pt informed of MD instructions Paging DoctorName Phone DateTime Action Result/Outcome Notes Scarlette Calico - MD [phone] 11/02/2016 7:42:58 PM Doctor Paged Paged On Call Back to Call [place] Scarlette Calico - MD 11/02/2016 7:47:40 PM Message Result Spoke with On Call - General MD states if pain is unrelenting pt would need to be seen in UC or ED to get something for pain. They don't make numbing ear drops anymore. Nurse to notify pt

## 2016-11-03 NOTE — Telephone Encounter (Signed)
Russell [name] notified as instructed by telephone.  He states that is what the on call nurse told him last night.  He states that his ears are feeling much better today.

## 2016-11-03 NOTE — Telephone Encounter (Signed)
To my knowledge, all of the pain relieving ear drops have been discontinued.   I would use tylenol, ibuprofen Sudafed may help relieve some of his ear pressure

## 2016-11-16 ENCOUNTER — Encounter: Payer: Self-pay | Admitting: *Deleted

## 2016-11-20 DIAGNOSIS — M9903 Segmental and somatic dysfunction of lumbar region: Secondary | ICD-10-CM | POA: Diagnosis not present

## 2016-11-20 DIAGNOSIS — M9902 Segmental and somatic dysfunction of thoracic region: Secondary | ICD-10-CM | POA: Diagnosis not present

## 2016-11-20 DIAGNOSIS — M5386 Other specified dorsopathies, lumbar region: Secondary | ICD-10-CM | POA: Diagnosis not present

## 2016-11-20 DIAGNOSIS — M9901 Segmental and somatic dysfunction of cervical region: Secondary | ICD-10-CM | POA: Diagnosis not present

## 2016-11-22 ENCOUNTER — Ambulatory Visit (INDEPENDENT_AMBULATORY_CARE_PROVIDER_SITE_OTHER): Payer: BLUE CROSS/BLUE SHIELD | Admitting: Cardiovascular Disease

## 2016-11-22 ENCOUNTER — Encounter: Payer: Self-pay | Admitting: Cardiovascular Disease

## 2016-11-22 VITALS — BP 142/82 | Ht 72.0 in | Wt 183.2 lb

## 2016-11-22 DIAGNOSIS — R079 Chest pain, unspecified: Secondary | ICD-10-CM

## 2016-11-22 DIAGNOSIS — Z8249 Family history of ischemic heart disease and other diseases of the circulatory system: Secondary | ICD-10-CM | POA: Diagnosis not present

## 2016-11-22 NOTE — Progress Notes (Signed)
     11/22/2016 Russell [name]   11/03/1985  [id number]  Primary Physician Owens Loffler, MD Primary Cardiologist: Lorretta Harp MD Renae Gloss  HPI:  Russell [name] is a delightful 31 year old thin fit-appearing married Caucasian male father of 2 children who works as a Theme park manager. He was referred by Dr. Edilia Bo for cardiovascular evaluation primarily because his father at age 44 recently had aortic valve repair by Dr. Roxy Manns. The patient has no cardiac risk factors. He walks 2 miles a day. Never had a heart attack or stroke. Does have Crohn's disease.   Current Outpatient Prescriptions  Medication Sig Dispense Refill  . Eszopiclone 3 MG TABS Take 1 tablet (3 mg total) by mouth at bedtime. Take immediately before bedtime 30 tablet 0  . levofloxacin (LEVAQUIN) 500 MG tablet Take 1 tablet (500 mg total) by mouth daily. 21 tablet 0  . PARoxetine (PAXIL) 20 MG tablet Take 1 tablet (20 mg total) by mouth daily. 30 tablet 2   No current facility-administered medications for this visit.     Allergies  Allergen Reactions  . Amoxicillin Rash    Social History   Social History  . Marital status: Married    Spouse name: Danae Chen  . Number of children: 2  . Years of education: Masters   Occupational History  . Pastor    Social History Main Topics  . Smoking status: Never Smoker  . Smokeless tobacco: Never Used  . Alcohol use No  . Drug use: No  . Sexual activity: Not on file   Other Topics Concern  . Not on file   Social History Narrative   Married      1 son      Youth minister; [employer]           Review of Systems: General: negative for chills, fever, night sweats or weight changes.  Cardiovascular: negative for chest pain, dyspnea on exertion, edema, orthopnea, palpitations, paroxysmal nocturnal dyspnea or shortness of breath Dermatological: negative for rash Respiratory: negative for cough or wheezing Urologic: negative for  hematuria Abdominal: negative for nausea, vomiting, diarrhea, bright red blood per rectum, melena, or hematemesis Neurologic: negative for visual changes, syncope, or dizziness All other systems reviewed and are otherwise negative except as noted above.    Blood pressure (!) 142/82, height 6' (1.829 m), weight 183 lb 3.2 oz (83.1 kg).  General appearance: alert and no distress Neck: no adenopathy, no carotid bruit, no JVD, supple, symmetrical, trachea midline and thyroid not enlarged, symmetric, no tenderness/mass/nodules Lungs: clear to auscultation bilaterally Heart: regular rate and rhythm, S1, S2 normal, no murmur, click, rub or gallop Extremities: extremities normal, atraumatic, no cyanosis or edema  EKG sinus rhythm at 72 with incomplete right bundle-branch block. I personally reviewed this EKG.  ASSESSMENT AND PLAN:   Family history of valvular heart disease Russell [name] is a healthy 31 year old married Caucasian male whose father had aortic valve repair by Dr. Roxy Manns within the last several weeks because of aortic insufficiency. I do not hear a murmur and is completely asymptomatic. He is tall, lanky. Concerned about the possibility of Marfan's. And therefore going to order a 2-D echo to assess his aortic valve and thoracic aorta.      Lorretta Harp MD FACP,FACC,FAHA, [facility] 11/22/2016 2:14 PM

## 2016-11-22 NOTE — Assessment & Plan Note (Signed)
Russell [name] is a healthy 31 year old married Caucasian male whose father had aortic valve repair by Dr. Roxy Manns within the last several weeks because of aortic insufficiency. I do not hear a murmur and is completely asymptomatic. He is tall, lanky. Concerned about the possibility of Marfan's. And therefore going to order a 2-D echo to assess his aortic valve and thoracic aorta.

## 2016-11-22 NOTE — Patient Instructions (Signed)
Medication Instructions: Your physician recommends that you continue on your current medications as directed. Please refer to the Current Medication list given to you today.   Testing/Procedures: Your physician has requested that you have an echocardiogram. Echocardiography is a painless test that uses sound waves to create images of your heart. It provides your doctor with information about the size and shape of your heart and how well your heart's chambers and valves are working. This procedure takes approximately one hour. There are no restrictions for this procedure.  Follow-Up: Your physician recommends that you schedule a follow-up appointment as needed with Dr. Gwenlyn Found.

## 2016-11-22 NOTE — Addendum Note (Signed)
Addended by: Debria Garret L on: 11/22/2016 03:00 PM   Modules accepted: Orders

## 2016-12-05 ENCOUNTER — Ambulatory Visit (HOSPITAL_COMMUNITY): Payer: BLUE CROSS/BLUE SHIELD | Attending: Cardiology

## 2016-12-05 ENCOUNTER — Other Ambulatory Visit: Payer: Self-pay

## 2016-12-05 DIAGNOSIS — R079 Chest pain, unspecified: Secondary | ICD-10-CM | POA: Diagnosis not present

## 2016-12-11 ENCOUNTER — Ambulatory Visit (INDEPENDENT_AMBULATORY_CARE_PROVIDER_SITE_OTHER): Payer: BLUE CROSS/BLUE SHIELD | Admitting: Family Medicine

## 2016-12-11 ENCOUNTER — Encounter: Payer: Self-pay | Admitting: Family Medicine

## 2016-12-11 VITALS — BP 110/84 | HR 57 | Temp 98.5°F | Ht 71.5 in | Wt 184.8 lb

## 2016-12-11 DIAGNOSIS — F5101 Primary insomnia: Secondary | ICD-10-CM | POA: Diagnosis not present

## 2016-12-11 DIAGNOSIS — F411 Generalized anxiety disorder: Secondary | ICD-10-CM | POA: Insufficient documentation

## 2016-12-11 MED ORDER — TRAZODONE HCL 50 MG PO TABS: 50.0000 mg | ORAL_TABLET | Freq: Every day | ORAL | 2 refills | Status: DC

## 2016-12-11 MED ORDER — ESZOPICLONE 2 MG PO TABS: 2.0000 mg | ORAL_TABLET | Freq: Every evening | ORAL | 3 refills | Status: DC | PRN

## 2016-12-11 MED ORDER — PAROXETINE HCL 20 MG PO TABS: 20.0000 mg | ORAL_TABLET | Freq: Every day | ORAL | 1 refills | Status: DC

## 2016-12-11 NOTE — Progress Notes (Signed)
Dr. Frederico Hamman T. Mao Lockner, MD, [facility] [place], [address] Phone: [phone] Fax: [phone]  12/11/2016  Patient: Russell [name], MRN: [id number], DOB: January 01, 1986, 31 y.o.  Primary Physician:  Owens Loffler, MD   Chief Complaint  Patient presents with  . Follow-up    6 week   Subjective:   Russell [name] is a 31 y.o. very pleasant male patient who presents with the following:  Following up on anxiety, doing better. Tol paxil.  No dep  Insomnia better, some AM grogginess with Lunesta  Past Medical History, Surgical History, Social History, Family History, Problem List, Medications, and Allergies have been reviewed and updated if relevant.  Patient Active Problem List   Diagnosis Date Noted  . Family history of valvular heart disease 11/22/2016  . CROHN'S [facility] INTESTINE 05/26/2010  . IRRITABLE BOWEL SYNDROME 09/25/2007    Past Medical History:  Diagnosis Date  . Crohn's disease ([place])   . Irritable bowel syndrome   . Regional enteritis of small intestine ([place]) 7/11   Crohn's ileitis    Past Surgical History:  Procedure Laterality Date  . COLONOSCOPY  7/11   Dr. Ardis Hughs  . TONSILLECTOMY AND ADENOIDECTOMY  1990's    Social History   Social History  . Marital status: Married    Spouse name: Danae Chen  . Number of children: 2  . Years of education: Masters   Occupational History  . Pastor    Social History Main Topics  . Smoking status: Never Smoker  . Smokeless tobacco: Never Used  . Alcohol use No  . Drug use: No  . Sexual activity: Not on file   Other Topics Concern  . Not on file   Social History Narrative   Married      1 son      Youth minister; [employer]          Family History  Problem Relation Age of Onset  . Healthy Mother   . Healthy Father   . Crohn's disease Cousin   . Hypertension Unknown        family hx  . Colon cancer Maternal  Grandfather   . Colon polyps Maternal Grandfather   . Coronary artery disease Neg Hx   . Diabetes Neg Hx     Allergies  Allergen Reactions  . Amoxicillin Rash    Medication list reviewed and updated in full in [place].   GEN: No acute illnesses, no fevers, chills. GI: No n/v/d, eating normally Pulm: No SOB Interactive and getting along well at home.  Otherwise, ROS is as per the HPI.  Objective:   BP 110/84   Pulse (!) 57   Temp 98.5 F (36.9 C) (Oral)   Ht 5' 11.5" (1.816 m)   Wt 184 lb 12 oz (83.8 kg)   BMI 25.41 kg/m   GEN: WDWN, NAD, Non-toxic, A & O x 3 HEENT: Atraumatic, Normocephalic. Neck supple. No masses, No LAD. Ears and Nose: No external deformity. CV: RRR, No M/G/R. No JVD. No thrill. No extra heart sounds. PULM: CTA B, no wheezes, crackles, rhonchi. No retractions. No resp. distress. No accessory muscle use. EXTR: No c/c/e NEURO Normal gait.  PSYCH: Normally interactive. Conversant. Not depressed or anxious appearing.  Calm demeanor.   Laboratory and Imaging Data:  Assessment and Plan:   Generalized anxiety disorder  Primary insomnia  Try lower dose lunesta vs trazadone.  Cont paxil  Follow-up:  next year cpx   Meds ordered this encounter  Medications  . PARoxetine (PAXIL) 20 MG tablet    Sig: Take 1 tablet (20 mg total) by mouth daily.    Dispense:  90 tablet    Refill:  1  . eszopiclone (LUNESTA) 2 MG TABS tablet    Sig: Take 1 tablet (2 mg total) by mouth at bedtime as needed for sleep. Take immediately before bedtime    Dispense:  30 tablet    Refill:  3  . traZODone (DESYREL) 50 MG tablet    Sig: Take 1-2 tablets (50-100 mg total) by mouth at bedtime.    Dispense:  60 tablet    Refill:  2   Medications Discontinued During This Encounter  Medication Reason  . levofloxacin (LEVAQUIN) 500 MG tablet Completed Course  . Eszopiclone 3 MG TABS   . PARoxetine (PAXIL) 20 MG tablet Reorder   No orders of the defined types  were placed in this encounter.   Signed,  Maud Deed. Annete Ayuso, MD   Allergies as of 12/11/2016      Reactions   Amoxicillin Rash      Medication List       Accurate as of 12/11/16  9:30 AM. Always use your most recent med list.          eszopiclone 2 MG Tabs tablet Commonly known as:  LUNESTA Take 1 tablet (2 mg total) by mouth at bedtime as needed for sleep. Take immediately before bedtime   PARoxetine 20 MG tablet Commonly known as:  PAXIL Take 1 tablet (20 mg total) by mouth daily.   traZODone 50 MG tablet Commonly known as:  DESYREL Take 1-2 tablets (50-100 mg total) by mouth at bedtime.

## 2016-12-18 DIAGNOSIS — M9903 Segmental and somatic dysfunction of lumbar region: Secondary | ICD-10-CM | POA: Diagnosis not present

## 2016-12-18 DIAGNOSIS — M9902 Segmental and somatic dysfunction of thoracic region: Secondary | ICD-10-CM | POA: Diagnosis not present

## 2016-12-18 DIAGNOSIS — M5386 Other specified dorsopathies, lumbar region: Secondary | ICD-10-CM | POA: Diagnosis not present

## 2016-12-18 DIAGNOSIS — M9901 Segmental and somatic dysfunction of cervical region: Secondary | ICD-10-CM | POA: Diagnosis not present

## 2016-12-19 DIAGNOSIS — S61211S Laceration without foreign body of left index finger without damage to nail, sequela: Secondary | ICD-10-CM | POA: Diagnosis not present

## 2017-01-02 DIAGNOSIS — D220 Melanocytic nevi of lip: Secondary | ICD-10-CM | POA: Diagnosis not present

## 2017-01-04 DIAGNOSIS — M9903 Segmental and somatic dysfunction of lumbar region: Secondary | ICD-10-CM | POA: Diagnosis not present

## 2017-01-04 DIAGNOSIS — M5431 Sciatica, right side: Secondary | ICD-10-CM | POA: Diagnosis not present

## 2017-01-04 DIAGNOSIS — M5386 Other specified dorsopathies, lumbar region: Secondary | ICD-10-CM | POA: Diagnosis not present

## 2017-01-04 DIAGNOSIS — M9901 Segmental and somatic dysfunction of cervical region: Secondary | ICD-10-CM | POA: Diagnosis not present

## 2017-01-08 DIAGNOSIS — M5431 Sciatica, right side: Secondary | ICD-10-CM | POA: Diagnosis not present

## 2017-01-08 DIAGNOSIS — M5386 Other specified dorsopathies, lumbar region: Secondary | ICD-10-CM | POA: Diagnosis not present

## 2017-01-08 DIAGNOSIS — M9901 Segmental and somatic dysfunction of cervical region: Secondary | ICD-10-CM | POA: Diagnosis not present

## 2017-01-08 DIAGNOSIS — M9903 Segmental and somatic dysfunction of lumbar region: Secondary | ICD-10-CM | POA: Diagnosis not present

## 2017-01-17 DIAGNOSIS — J0100 Acute maxillary sinusitis, unspecified: Secondary | ICD-10-CM | POA: Diagnosis not present

## 2017-02-02 DIAGNOSIS — M5386 Other specified dorsopathies, lumbar region: Secondary | ICD-10-CM | POA: Diagnosis not present

## 2017-02-02 DIAGNOSIS — M9903 Segmental and somatic dysfunction of lumbar region: Secondary | ICD-10-CM | POA: Diagnosis not present

## 2017-02-02 DIAGNOSIS — M5431 Sciatica, right side: Secondary | ICD-10-CM | POA: Diagnosis not present

## 2017-02-02 DIAGNOSIS — M9901 Segmental and somatic dysfunction of cervical region: Secondary | ICD-10-CM | POA: Diagnosis not present

## 2017-02-09 DIAGNOSIS — M5386 Other specified dorsopathies, lumbar region: Secondary | ICD-10-CM | POA: Diagnosis not present

## 2017-02-09 DIAGNOSIS — M9901 Segmental and somatic dysfunction of cervical region: Secondary | ICD-10-CM | POA: Diagnosis not present

## 2017-02-09 DIAGNOSIS — M9903 Segmental and somatic dysfunction of lumbar region: Secondary | ICD-10-CM | POA: Diagnosis not present

## 2017-02-09 DIAGNOSIS — M9902 Segmental and somatic dysfunction of thoracic region: Secondary | ICD-10-CM | POA: Diagnosis not present

## 2017-03-02 DIAGNOSIS — M9903 Segmental and somatic dysfunction of lumbar region: Secondary | ICD-10-CM | POA: Diagnosis not present

## 2017-03-02 DIAGNOSIS — M5386 Other specified dorsopathies, lumbar region: Secondary | ICD-10-CM | POA: Diagnosis not present

## 2017-03-02 DIAGNOSIS — M9902 Segmental and somatic dysfunction of thoracic region: Secondary | ICD-10-CM | POA: Diagnosis not present

## 2017-03-02 DIAGNOSIS — M9901 Segmental and somatic dysfunction of cervical region: Secondary | ICD-10-CM | POA: Diagnosis not present

## 2017-03-19 ENCOUNTER — Ambulatory Visit (INDEPENDENT_AMBULATORY_CARE_PROVIDER_SITE_OTHER): Payer: BLUE CROSS/BLUE SHIELD | Admitting: Family Medicine

## 2017-03-19 ENCOUNTER — Encounter: Payer: Self-pay | Admitting: Family Medicine

## 2017-03-19 VITALS — BP 118/78 | HR 67 | Temp 98.3°F | Wt 189.5 lb

## 2017-03-19 DIAGNOSIS — J302 Other seasonal allergic rhinitis: Secondary | ICD-10-CM | POA: Diagnosis not present

## 2017-03-19 DIAGNOSIS — R05 Cough: Secondary | ICD-10-CM | POA: Diagnosis not present

## 2017-03-19 DIAGNOSIS — J0100 Acute maxillary sinusitis, unspecified: Secondary | ICD-10-CM

## 2017-03-19 DIAGNOSIS — R059 Cough, unspecified: Secondary | ICD-10-CM

## 2017-03-19 MED ORDER — BENZONATATE 100 MG PO CAPS: 100.0000 mg | ORAL_CAPSULE | Freq: Three times a day (TID) | ORAL | 0 refills | Status: DC | PRN

## 2017-03-19 MED ORDER — AZITHROMYCIN 250 MG PO TABS: ORAL_TABLET | ORAL | 0 refills | Status: DC

## 2017-03-19 MED ORDER — TRIAMCINOLONE ACETONIDE 55 MCG/ACT NA AERO: 2.0000 | INHALATION_SPRAY | Freq: Every day | NASAL | 12 refills | Status: DC

## 2017-03-19 NOTE — Patient Instructions (Signed)
It was a pleasure to meet you  Please use Afrin nasal spray twice a day for 4 days. Follow with prescription nasal spray twice a day for 4 days then go to once a day at night.  Try Sudafed regular, once in morning and once in early afternoon as needed.  Try neti pot or saline nasal spray (generic is fine for all). If not better in 4-5 days, can start antibiotic Drink enough fluids to make your urine light yellow. For fever/chill/muscle aches you can take over the counter acetaminophen or ibuprofen.  Please come back in if you are not better in 5-7 days or if you develop wheezing, shortness of breath or persistent vomiting.

## 2017-03-19 NOTE — Progress Notes (Signed)
Subjective:    Patient ID: Russell [name], male    DOB: 05-12-1986, 31 y.o.   MRN: [id number]  HPI This is a 31 yo male who presents today with sinus pressure, sore throat, intermittent laryngitis. Symptoms x 2.5 weeks. Takes mucinex, allegra. Worse with change of seasons. Took fluticasone with good results, but caused epistaxis. Does not feel like coming down with something. Thick, yellow nasal drainage. Facial pressure under eyes. Some ear pressure. Lymph nodes tender. Cough all night due to drainage. No SOB or wheeze. Has seen Dr. Redmond Baseman who recommends nasal surgery.  Patient flying out of town at end of week x 1 week.    Past Medical History:  Diagnosis Date  . Crohn's disease ([place])   . Irritable bowel syndrome   . Regional enteritis of small intestine ([place]) 7/11   Crohn's ileitis   Past Surgical History:  Procedure Laterality Date  . COLONOSCOPY  7/11   Dr. Ardis Hughs  . TONSILLECTOMY AND ADENOIDECTOMY  1990's   Family History  Problem Relation Age of Onset  . Healthy Mother   . Healthy Father   . Crohn's disease Cousin   . Hypertension Unknown        family hx  . Colon cancer Maternal Grandfather   . Colon polyps Maternal Grandfather   . Coronary artery disease Neg Hx   . Diabetes Neg Hx    Social History  Substance Use Topics  . Smoking status: Never Smoker  . Smokeless tobacco: Never Used  . Alcohol use No      Review of Systems Per HPI    Objective:   Physical Exam  Constitutional: He is oriented to person, place, and time. He appears well-developed and well-nourished. No distress.  HENT:  Head: Normocephalic and atraumatic.  Right Ear: External ear and ear canal normal.  Left Ear: External ear and ear canal normal.  Nose: Mucosal edema and septal deviation present. Right sinus exhibits maxillary sinus tenderness. Right sinus exhibits no frontal sinus tenderness. Left sinus exhibits maxillary sinus tenderness. Left sinus exhibits no frontal sinus  tenderness.  Bilateral TMs dull. Small amount post nasal drainage.   Eyes: Conjunctivae are normal.  Neck: Normal range of motion. Neck supple.  Cardiovascular: Normal rate, regular rhythm and normal heart sounds.   Pulmonary/Chest: Effort normal and breath sounds normal.  Lymphadenopathy:    He has no cervical adenopathy.  Neurological: He is alert and oriented to person, place, and time.  Skin: Skin is warm and dry. He is not diaphoretic.  Psychiatric: He has a normal mood and affect. His behavior is normal. Judgment and thought content normal.  Vitals reviewed.     BP 118/78 (BP Location: Right Arm, Patient Position: Sitting, Cuff Size: Normal)   Pulse 67   Temp 98.3 F (36.8 C) (Oral)   Wt 189 lb 8 oz (86 kg)   SpO2 97%   BMI 26.06 kg/m  Wt Readings from Last 3 Encounters:  03/19/17 189 lb 8 oz (86 kg)  12/11/16 184 lb 12 oz (83.8 kg)  11/22/16 183 lb 3.2 oz (83.1 kg)       Assessment & Plan:  1. Acute maxillary sinusitis, recurrence not specified -  Patient Instructions  It was a pleasure to meet you  Please use Afrin nasal spray twice a day for 4 days. Follow with prescription nasal spray twice a day for 4 days then go to once a day at night.  Try Sudafed regular, once in morning and  once in early afternoon as needed.  Try neti pot or saline nasal spray (generic is fine for all). If not better in 4-5 days, can start antibiotic Drink enough fluids to make your urine light yellow. For fever/chill/muscle aches you can take over the counter acetaminophen or ibuprofen.  Please come back in if you are not better in 5-7 days or if you develop wheezing, shortness of breath or persistent vomiting.   - azithromycin (ZITHROMAX) 250 MG tablet; Take 2 tabs PO x 1 dose, then 1 tab PO QD x 4 days  Dispense: 6 tablet; Refill: 0  2. Seasonal allergic rhinitis, unspecified trigger - triamcinolone (NASACORT) 55 MCG/ACT AERO nasal inhaler; Place 2 sprays into the nose daily.   Dispense: 1 Inhaler; Refill: 12  3. Cough - benzonatate (TESSALON) 100 MG capsule; Take 1 capsule (100 mg total) by mouth 3 (three) times daily as needed for cough.  Dispense: 20 capsule; Refill: 0   Clarene Reamer, FNP-BC  [facility]  03/19/2017 3:04 PM

## 2017-04-08 IMAGING — US US SCROTUM
1 series · 13 of 25 positions shown · non-contrast
Comparison: CT 05/25/2010

CLINICAL DATA: Bilateral testicular pain and supra testicular pain
5 days.

EXAM:
SCROTAL ULTRASOUND
DOPPLER ULTRASOUND OF THE TESTICLES
TECHNIQUE: Complete ultrasound examination of the testicles, epididymis, and
other scrotal structures was performed. Color and spectral Doppler
ultrasound were also utilized to evaluate blood flow to the
testicles.

[Series 1: us scrotum · 0.07mm/px · 13 of 53 slices shown]
[im 1/53]
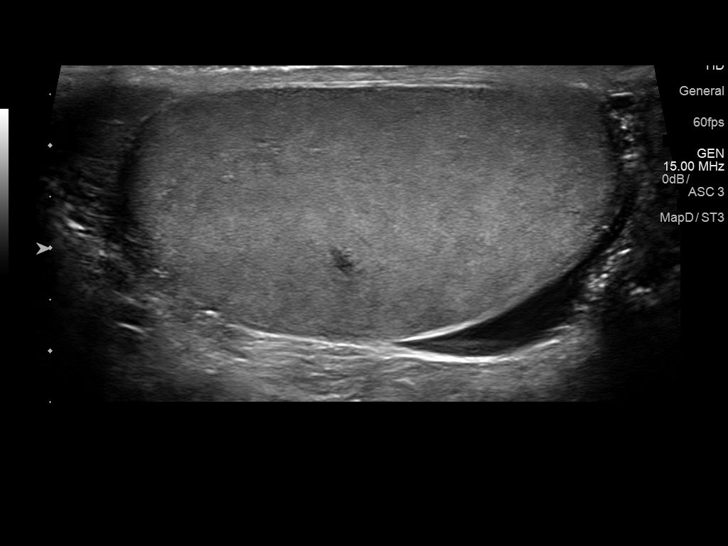
[im 5/53]
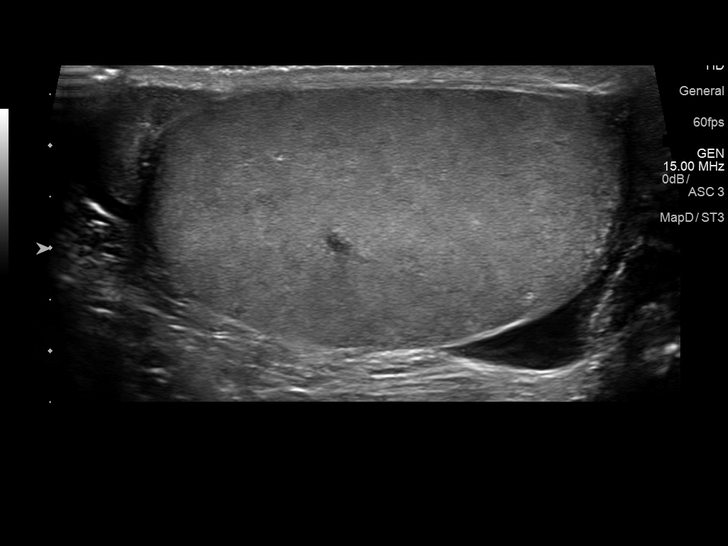
[im 9/53]
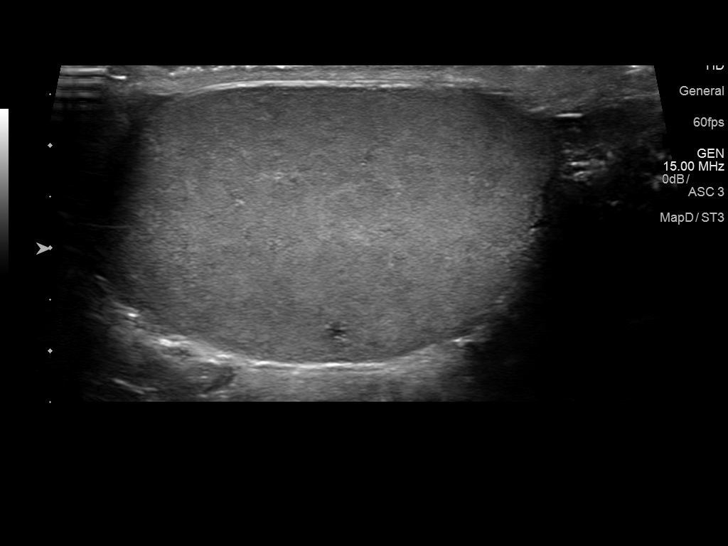
[im 14/53]
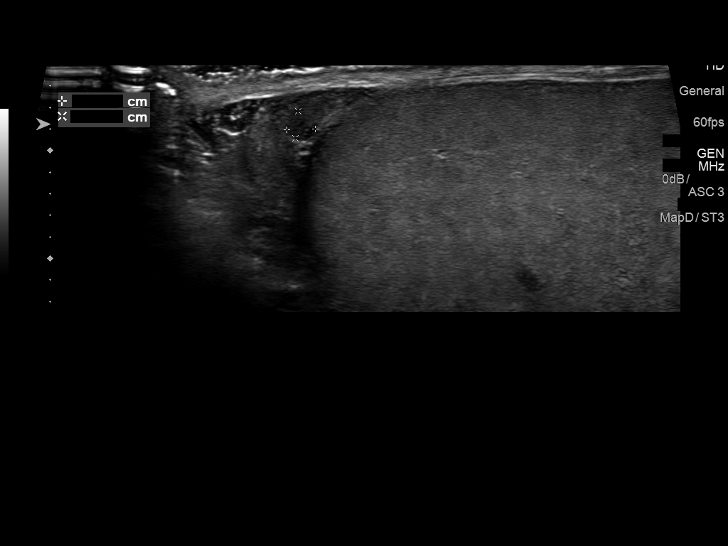
[im 18/53]
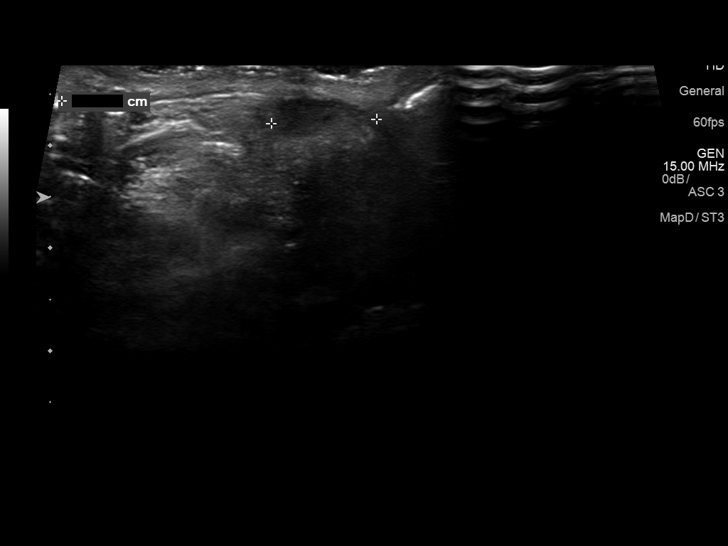
[im 22/53]
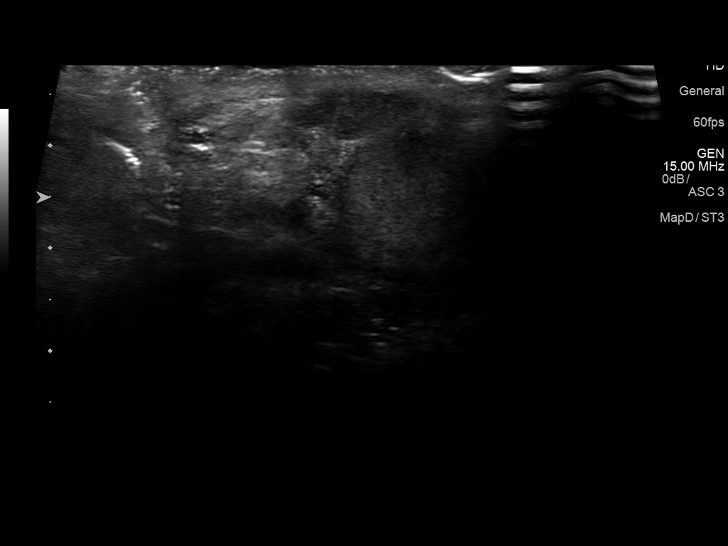
[im 27/53]
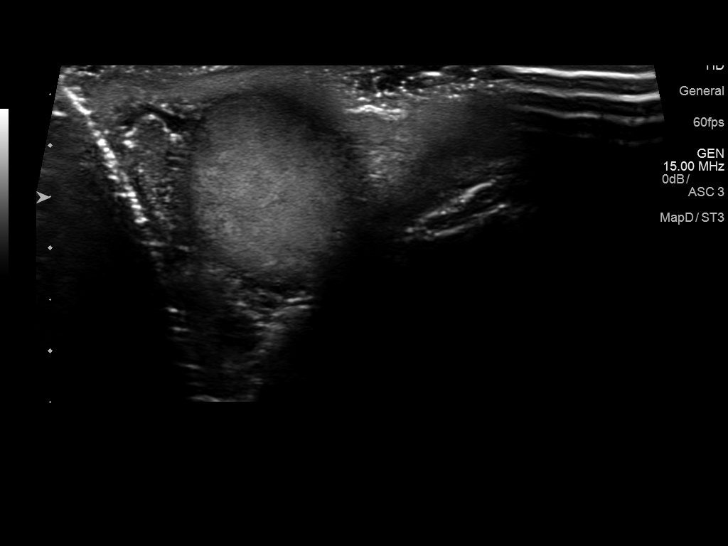
[im 31/53]
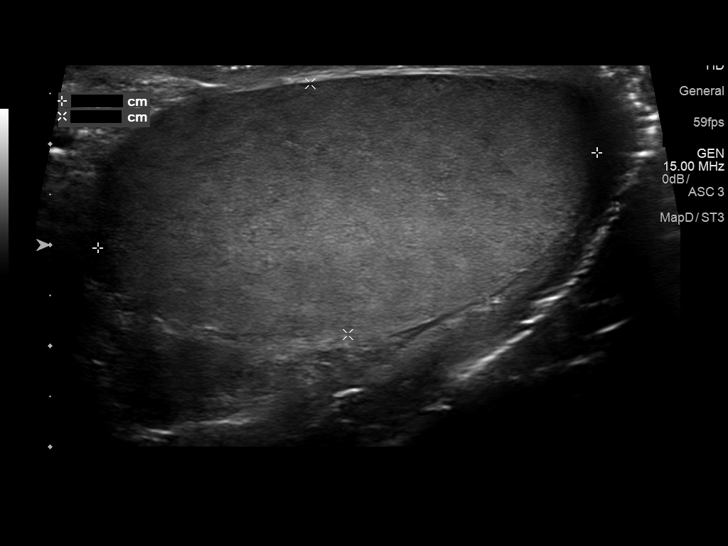
[im 35/53]
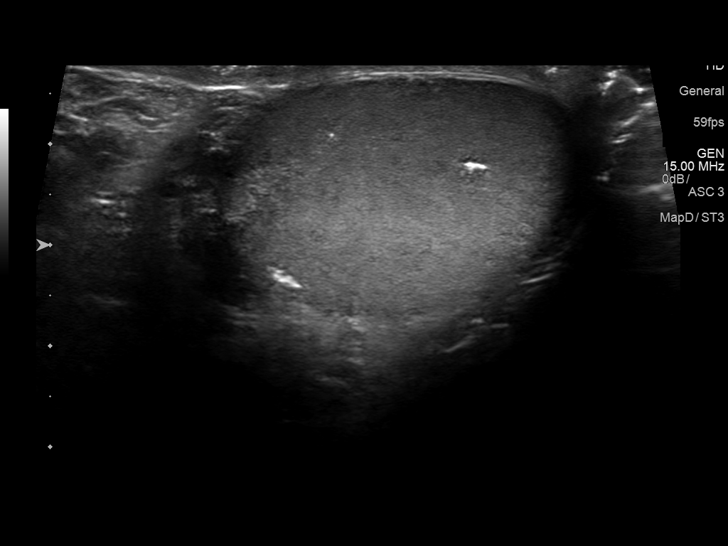
[im 40/53]
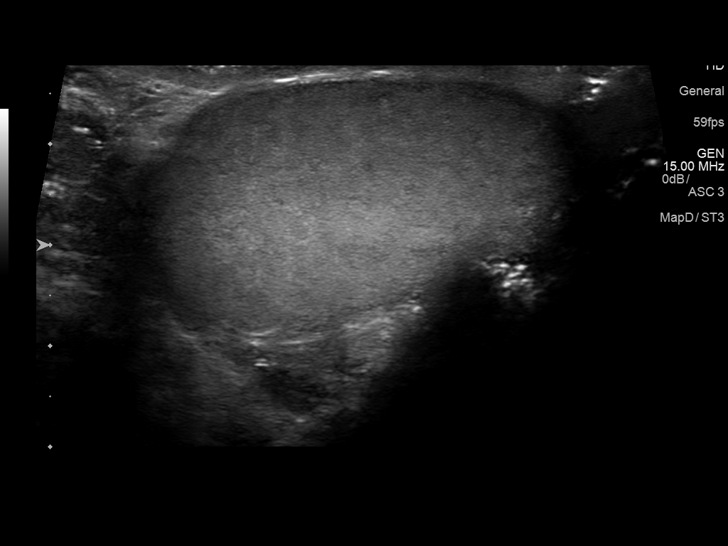
[im 44/53]
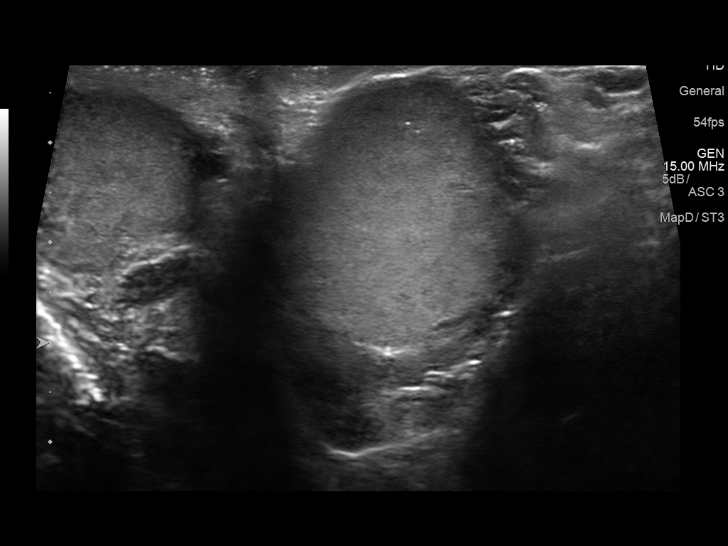
[im 48/53]
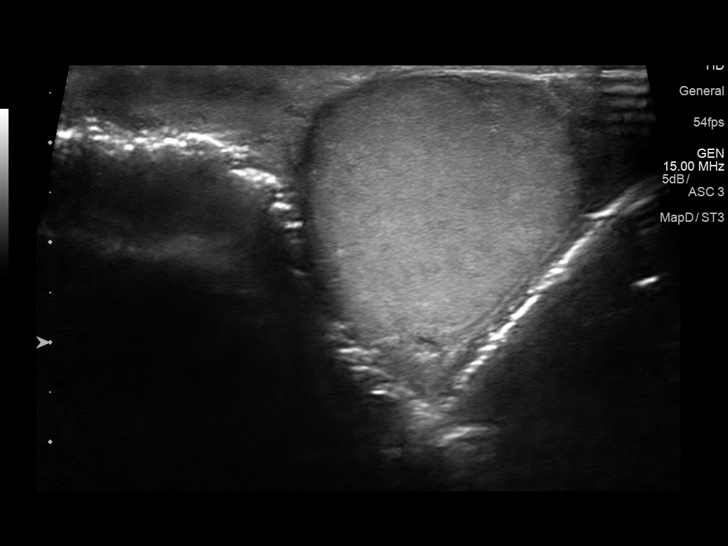
[im 53/53]
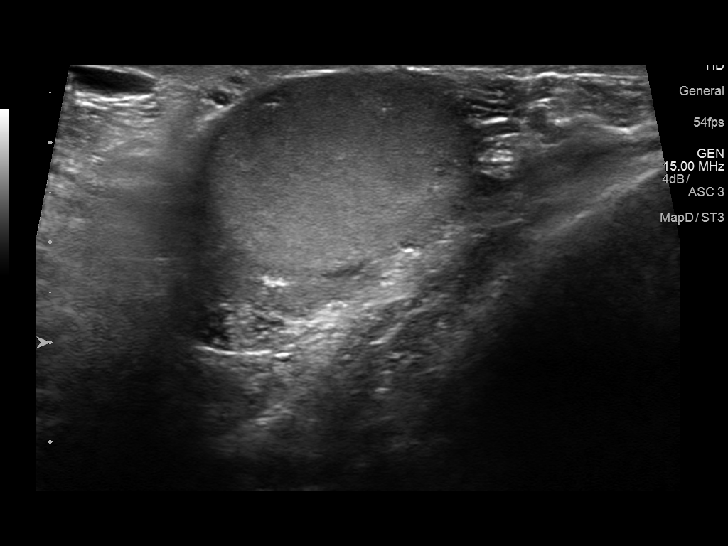

[13 of 25 positions shown; findings below may reference images not displayed]

FINDINGS: Right testicle

Measurements: 2.4 x 2.8 x 4.8 cm. No mass or microlithiasis
visualized.

Left testicle

Measurements: 2.5 x 3.0 x 5.0 cm. Few tiny calcifications with the
largest measuring 2 mm likely subtle microlithiasis. No focal mass.

Right epididymis: Possible cyst/spermatocele over the epididymal
head measuring 3 mm.

Left epididymis: Visualized portions within normal as the epididymal
head is difficult to visualize.

Hydrocele:  Small bilateral hydroceles right greater than left.

Varicocele:  None visualized.

Pulsed Doppler interrogation of both testes demonstrates normal low
resistance arterial and venous waveforms bilaterally.
IMPRESSION: No evidence of testicular torsion.

Small bilateral hydroceles right greater than left.

Tiny cyst/ spermatocele over the head of the right epididymis
measuring 3 mm.

Subtle left testicular microlithiasis. Current literature suggests
that testicular microlithiasis is not a significant independent risk
factor for development of testicular carcinoma, and that follow up
imaging is not warranted in the absence of other risk factors.
Monthly testicular self-examination and annual physical exams are
considered appropriate surveillance. If patient has other risk
factors for testicular carcinoma, then referral to Urology should be
considered. (Reference: Nyman, et al.: A 5-Year Follow up Study
of Asymptomatic Men with Testicular Microlithiasis. J Urol 9994;

## 2017-04-09 DIAGNOSIS — M9903 Segmental and somatic dysfunction of lumbar region: Secondary | ICD-10-CM | POA: Diagnosis not present

## 2017-04-09 DIAGNOSIS — M5386 Other specified dorsopathies, lumbar region: Secondary | ICD-10-CM | POA: Diagnosis not present

## 2017-04-09 DIAGNOSIS — M9901 Segmental and somatic dysfunction of cervical region: Secondary | ICD-10-CM | POA: Diagnosis not present

## 2017-04-09 DIAGNOSIS — M9902 Segmental and somatic dysfunction of thoracic region: Secondary | ICD-10-CM | POA: Diagnosis not present

## 2017-04-17 DIAGNOSIS — D220 Melanocytic nevi of lip: Secondary | ICD-10-CM | POA: Diagnosis not present

## 2017-04-17 DIAGNOSIS — L309 Dermatitis, unspecified: Secondary | ICD-10-CM | POA: Diagnosis not present

## 2017-04-25 DIAGNOSIS — M9902 Segmental and somatic dysfunction of thoracic region: Secondary | ICD-10-CM | POA: Diagnosis not present

## 2017-04-25 DIAGNOSIS — M9901 Segmental and somatic dysfunction of cervical region: Secondary | ICD-10-CM | POA: Diagnosis not present

## 2017-04-25 DIAGNOSIS — M9903 Segmental and somatic dysfunction of lumbar region: Secondary | ICD-10-CM | POA: Diagnosis not present

## 2017-04-25 DIAGNOSIS — M5386 Other specified dorsopathies, lumbar region: Secondary | ICD-10-CM | POA: Diagnosis not present

## 2017-05-08 NOTE — Progress Notes (Signed)
Dr. Frederico Hamman T. Caetano Oberhaus, MD, [facility] [place], [address] Phone: [phone] Fax: [phone]  05/09/2017  Patient: Russell [name], MRN: [id number], DOB: [birth year], 31 y.o.  Primary Physician:  Owens Loffler, MD   Chief Complaint  Patient presents with  . Abdominal Pain   Subjective:   Russell [name] is a 30 y.o. very pleasant male patient who presents with the following:  Going to [place]. Wants some zofran and phenergan.   Very nice young gentleman with Crohn's disease who presents with a presentation of a Crohn's flareup.  He is having some abdominal discomfort intermittently, and he thinks it is flared up after having some [place] food.  He also is traveling to [place], and wanted to have some nausea medication secondary to possible symptoms while he is traveling.  In the past gastroenterology has provided him with both Zofran and Phenergan.  Past Medical History, Surgical History, Social History, Family History, Problem List, Medications, and Allergies have been reviewed and updated if relevant.  Patient Active Problem List   Diagnosis Date Noted  . Generalized anxiety disorder 12/11/2016  . Family history of valvular heart disease 11/22/2016  . CROHN'S [facility] INTESTINE 05/26/2010  . IRRITABLE BOWEL SYNDROME 09/25/2007    Past Medical History:  Diagnosis Date  . Crohn's disease ([place])   . Irritable bowel syndrome   . 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 COLONOSCOPY  7/11   Dr. Ardis Hughs  . TONSILLECTOMY AND ADENOIDECTOMY  1990's    Social History   Socioeconomic History  . Marital status: Married    Spouse name: Danae Chen  . Number of children: 2  . Years of education: Masters  . Highest education level: Not on file  Social Needs  . Financial resource strain: Not on file  . Food insecurity - worry:  Not on file  . Food insecurity - inability: Not on file  . Transportation needs - medical: Not on file  . Transportation needs - non-medical: Not on file  Occupational History  . Occupation: Pastor  Tobacco Use  . Smoking status: Never Smoker  . Smokeless tobacco: Never Used  Substance and Sexual Activity  . Alcohol use: No    Alcohol/week: 0.0 oz  . Drug use: No  . Sexual activity: Not on file  Other Topics Concern  . Not on file  Social History Narrative   Married      1 son      Youth minister; [employer]          Family History  Problem Relation Age of Onset  . Healthy Mother   . Healthy Father   . Crohn's disease Cousin   . Hypertension Unknown        family hx  . Colon cancer Maternal Grandfather   . Colon polyps Maternal Grandfather   . Coronary artery disease Neg Hx   . Diabetes Neg Hx     Allergies  Allergen Reactions  . Amoxicillin Rash    Medication list reviewed and updated in full in [place].   GEN: No acute illnesses, no fevers, chills. GI: No n/v/d, eating normally Pulm: No SOB Interactive and getting along well at home.  Otherwise, ROS is as per the HPI.  Objective:   BP 100/60   Pulse 76   Temp 97.9 F (36.6 C) (Oral)  Ht 5' 11.5" (1.816 m)   Wt 193 lb 8 oz (87.8 kg)   BMI 26.61 kg/m   GEN: WDWN, NAD, Non-toxic, A & O x 3 HEENT: Atraumatic, Normocephalic. Neck supple. No masses, No LAD. Ears and Nose: No external deformity. CV: RRR, No M/G/R. No JVD. No thrill. No extra heart sounds. PULM: CTA B, no wheezes, crackles, rhonchi. No retractions. No resp. distress. No accessory muscle use. ABD: S, mild nonfocal tenderness, ND, + BS, No rebound, No HSM  EXTR: No c/c/e NEURO Normal gait.  PSYCH: Normally interactive. Conversant. Not depressed or anxious appearing.  Calm demeanor.   Laboratory and Imaging Data:  Assessment and Plan:   Crohn's disease of small intestine with other complication ([place])  Travel  advice encounter  Current Crohn's flare.  Prednisone as below.  Gastroenterology notes reviewed.  Also gave him both some Phenergan and Zofran.  He is going to follow up with me over the summer prior to his trip to [place].  Follow-up: No Follow-up on file.  No future appointments.  Meds ordered this encounter  Medications  . predniSONE (DELTASONE) 20 MG tablet    Sig: 2 tabs po for 4 days, then 1 tab for 4 days    Dispense:  12 tablet    Refill:  0  . promethazine (PHENERGAN) 25 MG tablet    Sig: Take 1 tablet (25 mg total) by mouth every 6 (six) hours as needed for nausea or vomiting.    Dispense:  30 tablet    Refill:  2  . ondansetron (ZOFRAN-ODT) 4 MG disintegrating tablet    Sig: Take 1 tablet (4 mg total) by mouth every 8 (eight) hours as needed for nausea or vomiting.    Dispense:  20 tablet    Refill:  2   Medications Discontinued During This Encounter  Medication Reason  . azithromycin (ZITHROMAX) 250 MG tablet Completed Course  . benzonatate (TESSALON) 100 MG capsule Completed Course  . eszopiclone (LUNESTA) 2 MG TABS tablet Completed Course  . traZODone (DESYREL) 50 MG tablet Completed Course  . triamcinolone (NASACORT) 55 MCG/ACT AERO nasal inhaler Completed Course   No orders of the defined types were placed in this encounter.   Signed,  Maud Deed. Natalija Mavis, MD   Allergies as of 05/09/2017      Reactions   Amoxicillin Rash      Medication List        Accurate as of 05/09/17  8:26 AM. Always use your most recent med list.          NEXIUM PO Take 1 capsule by mouth daily. OTC   ondansetron 4 MG disintegrating tablet Commonly known as:  ZOFRAN-ODT Take 1 tablet (4 mg total) by mouth every 8 (eight) hours as needed for nausea or vomiting.   PARoxetine 20 MG tablet Commonly known as:  PAXIL Take 1 tablet (20 mg total) by mouth daily.   predniSONE 20 MG tablet Commonly known as:  DELTASONE 2 tabs po for 4 days, then 1 tab for 4 days     promethazine 25 MG tablet Commonly known as:  PHENERGAN Take 1 tablet (25 mg total) by mouth every 6 (six) hours as needed for nausea or vomiting.

## 2017-05-09 ENCOUNTER — Encounter: Payer: Self-pay | Admitting: Family Medicine

## 2017-05-09 ENCOUNTER — Other Ambulatory Visit: Payer: Self-pay

## 2017-05-09 ENCOUNTER — Ambulatory Visit (INDEPENDENT_AMBULATORY_CARE_PROVIDER_SITE_OTHER): Payer: BLUE CROSS/BLUE SHIELD | Admitting: Family Medicine

## 2017-05-09 VITALS — BP 100/60 | HR 76 | Temp 97.9°F | Ht 71.5 in | Wt 193.5 lb

## 2017-05-09 DIAGNOSIS — Z7184 Encounter for health counseling related to travel: Secondary | ICD-10-CM

## 2017-05-09 DIAGNOSIS — K50018 Crohn's disease of small intestine with other complication: Secondary | ICD-10-CM | POA: Diagnosis not present

## 2017-05-09 DIAGNOSIS — Z7189 Other specified counseling: Secondary | ICD-10-CM

## 2017-05-09 MED ORDER — ONDANSETRON 4 MG PO TBDP: 4.0000 mg | ORAL_TABLET | Freq: Three times a day (TID) | ORAL | 2 refills | Status: DC | PRN

## 2017-05-09 MED ORDER — PROMETHAZINE HCL 25 MG PO TABS: 25.0000 mg | ORAL_TABLET | Freq: Four times a day (QID) | ORAL | 2 refills | Status: DC | PRN

## 2017-05-09 MED ORDER — PREDNISONE 20 MG PO TABS: ORAL_TABLET | ORAL | 0 refills | Status: DC

## 2017-05-23 DIAGNOSIS — M9902 Segmental and somatic dysfunction of thoracic region: Secondary | ICD-10-CM | POA: Diagnosis not present

## 2017-05-23 DIAGNOSIS — M9901 Segmental and somatic dysfunction of cervical region: Secondary | ICD-10-CM | POA: Diagnosis not present

## 2017-05-23 DIAGNOSIS — M9903 Segmental and somatic dysfunction of lumbar region: Secondary | ICD-10-CM | POA: Diagnosis not present

## 2017-05-23 DIAGNOSIS — M5386 Other specified dorsopathies, lumbar region: Secondary | ICD-10-CM | POA: Diagnosis not present

## 2017-06-28 DIAGNOSIS — J0100 Acute maxillary sinusitis, unspecified: Secondary | ICD-10-CM | POA: Diagnosis not present

## 2017-07-02 ENCOUNTER — Encounter: Payer: Self-pay | Admitting: Family Medicine

## 2017-07-02 ENCOUNTER — Ambulatory Visit: Payer: BLUE CROSS/BLUE SHIELD | Admitting: Family Medicine

## 2017-07-02 ENCOUNTER — Other Ambulatory Visit: Payer: Self-pay | Admitting: Family Medicine

## 2017-07-02 ENCOUNTER — Other Ambulatory Visit: Payer: Self-pay

## 2017-07-02 VITALS — BP 104/70 | HR 58 | Temp 98.3°F | Ht 71.5 in | Wt 194.8 lb

## 2017-07-02 DIAGNOSIS — J0111 Acute recurrent frontal sinusitis: Secondary | ICD-10-CM | POA: Diagnosis not present

## 2017-07-02 MED ORDER — LEVOFLOXACIN 500 MG PO TABS: 500.0000 mg | ORAL_TABLET | Freq: Every day | ORAL | 0 refills | Status: DC

## 2017-07-02 NOTE — Telephone Encounter (Signed)
Last office visit today.  Patient is office now.  Not on medication list.  Refill?

## 2017-07-02 NOTE — Progress Notes (Signed)
Dr. Frederico Hamman T. Rosemarie Galvis, MD, [facility] Medicine [place], [address] Phone: [phone] Fax: [phone]  07/02/2017  Patient: Russell [name], MRN: [id number], DOB: July 19, 1985, 32 y.o.  Primary Physician:  Owens Loffler, MD   Chief Complaint  Patient presents with  . Bronchitis    Seen at [facility] and given z-pak-not any better-leaves for [place] tomorrow  . Sinusitis   Subjective:   This 32 y.o. male patient presents with runny nose, sneezing, cough, sore throat, malaise and minimal / low-grade fever for > 2 week. Now the primary complaint has become sinus pressure and pain behind the eyes and in the upper, anterior face.   Failed zpak already  The patent denies sore throat as the primary complaint. Denies sthortness of breath/wheezing, high fever, chest pain, significant myalgia, otalgia, abdominal pain, changes in bowel or bladder.  PMH, PHS, Allergies, Problem List, Medications, Family History, and Social History have all been reviewed.  Patient Active Problem List   Diagnosis Date Noted  . Generalized anxiety disorder 12/11/2016  . Family history of valvular heart disease 11/22/2016  . CROHN'S [facility] INTESTINE 05/26/2010  . IRRITABLE BOWEL SYNDROME 09/25/2007    Past Medical History:  Diagnosis Date  . Crohn's disease ([place])   . Irritable bowel syndrome   . 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 COLONOSCOPY  7/11   Dr. Ardis Hughs  . TONSILLECTOMY AND ADENOIDECTOMY  1990's    Social History   Socioeconomic History  . Marital status: Married    Spouse name: Danae Chen  . Number of children: 2  . Years of education: Masters  . Highest education level: Not on file  Social Needs  . Financial resource strain: Not on file  . Food insecurity - worry: Not on file  . Food insecurity - inability: Not on file  . Transportation  needs - medical: Not on file  . Transportation needs - non-medical: Not on file  Occupational History  . Occupation: Pastor  Tobacco Use  . Smoking status: Never Smoker  . Smokeless tobacco: Never Used  Substance and Sexual Activity  . Alcohol use: No    Alcohol/week: 0.0 oz  . Drug use: No  . Sexual activity: Not on file  Other Topics Concern  . Not on file  Social History Narrative   Married      1 son      Youth minister; [employer]          Family History  Problem Relation Age of Onset  . Healthy Mother   . Healthy Father   . Crohn's disease Cousin   . Hypertension Unknown        family hx  . Colon cancer Maternal Grandfather   . Colon polyps Maternal Grandfather   . Coronary artery disease Neg Hx   . Diabetes Neg Hx     Allergies  Allergen Reactions  . Amoxicillin Rash    Medication list reviewed and updated in full in [place].  ROS as above, eating and drinking - tolerating PO. Urinating normally. No excessive vomitting or diarrhea. O/w as above.  Objective:   Blood pressure 104/70, pulse (!) 58, temperature 98.3 F (36.8 C), temperature source Oral, height 5' 11.5" (1.816 m), weight 194 lb 12 oz (88.3 kg), SpO2 96 %.  GEN: WDWN, Non-toxic, Atraumatic, normocephalic. A and O x 3.  HEENT: Oropharynx clear without exudate, MMM, no significant LAD, mild rhinnorhea Sinuses: Right Frontal, ethmoid, and maxillary: Tender frontal Left Frontal, Ethmoid, and maxillary: non-Tender Ears: TM clear, COL visualized with good landmarks CV: RRR, no m/g/r. Pulm: CTA B, no wheezes, rhonchi, or crackles, normal respiratory effort. EXT: no c/c/e Psych: well oriented, neither depressed nor anxious in appearance  Assessment and Plan:   Acute recurrent frontal sinusitis  Acute sinusitis: ABX as below.   Reviewed symptomatic care as well as ABX in this case.    Follow-up: No Follow-up on file.  Meds ordered this encounter  Medications  .  levofloxacin (LEVAQUIN) 500 MG tablet    Sig: Take 1 tablet (500 mg total) by mouth daily.    Dispense:  10 tablet    Refill:  0    Signed,  Cieara Stierwalt T. Jae Skeet, MD  Patient's Medications  New Prescriptions   ESZOPICLONE (LUNESTA) 2 MG TABS TABLET    TAKE 1 TABLET BY MOUTH EVERY NIGHT AT BEDTIME AS NEEDED FOR SLEEP. TAKE IMMEDIATELY BEFORE BEDTIME.   LEVOFLOXACIN (LEVAQUIN) 500 MG TABLET    Take 1 tablet (500 mg total) by mouth daily.  Previous Medications   AZITHROMYCIN (ZITHROMAX) 250 MG TABLET    Take by mouth daily.   ESOMEPRAZOLE MAGNESIUM (NEXIUM PO)    Take 1 capsule by mouth daily. OTC   ONDANSETRON (ZOFRAN-ODT) 4 MG DISINTEGRATING TABLET    Take 1 tablet (4 mg total) by mouth every 8 (eight) hours as needed for nausea or vomiting.   PAROXETINE (PAXIL) 20 MG TABLET    Take 1 tablet (20 mg total) by mouth daily.  Modified Medications   No medications on file  Discontinued Medications   PREDNISONE (DELTASONE) 20 MG TABLET    2 tabs po for 4 days, then 1 tab for 4 days   PROMETHAZINE (PHENERGAN) 25 MG TABLET    Take 1 tablet (25 mg total) by mouth every 6 (six) hours as needed for nausea or vomiting.

## 2017-07-16 DIAGNOSIS — M9902 Segmental and somatic dysfunction of thoracic region: Secondary | ICD-10-CM | POA: Diagnosis not present

## 2017-07-16 DIAGNOSIS — M5386 Other specified dorsopathies, lumbar region: Secondary | ICD-10-CM | POA: Diagnosis not present

## 2017-07-16 DIAGNOSIS — M9901 Segmental and somatic dysfunction of cervical region: Secondary | ICD-10-CM | POA: Diagnosis not present

## 2017-07-16 DIAGNOSIS — M9903 Segmental and somatic dysfunction of lumbar region: Secondary | ICD-10-CM | POA: Diagnosis not present

## 2017-08-01 ENCOUNTER — Ambulatory Visit: Payer: BLUE CROSS/BLUE SHIELD | Admitting: Family Medicine

## 2017-08-01 ENCOUNTER — Encounter: Payer: Self-pay | Admitting: Family Medicine

## 2017-08-01 ENCOUNTER — Other Ambulatory Visit: Payer: Self-pay

## 2017-08-01 VITALS — BP 108/70 | HR 94 | Temp 102.4°F | Ht 71.5 in | Wt 195.2 lb

## 2017-08-01 DIAGNOSIS — R509 Fever, unspecified: Secondary | ICD-10-CM | POA: Diagnosis not present

## 2017-08-01 DIAGNOSIS — J101 Influenza due to other identified influenza virus with other respiratory manifestations: Secondary | ICD-10-CM | POA: Diagnosis not present

## 2017-08-01 LAB — POC INFLUENZA A&B (BINAX/QUICKVUE)
Influenza A, POC: POSITIVE — AB
Influenza B, POC: NEGATIVE

## 2017-08-01 MED ORDER — ALBUTEROL SULFATE HFA 108 (90 BASE) MCG/ACT IN AERS: 2.0000 | INHALATION_SPRAY | Freq: Four times a day (QID) | RESPIRATORY_TRACT | 0 refills | Status: DC | PRN

## 2017-08-01 MED ORDER — OSELTAMIVIR PHOSPHATE 75 MG PO CAPS: 75.0000 mg | ORAL_CAPSULE | Freq: Two times a day (BID) | ORAL | 0 refills | Status: DC

## 2017-08-01 NOTE — Progress Notes (Signed)
Dr. Frederico Hamman T. Jveon Pound, MD, [facility] [place], [address] Phone: [phone] Fax: [phone]  08/01/2017  Patient: Russell [name], MRN: [id number], DOB: 04/11/1986, 32 y.o.  Primary Physician:  Owens Loffler, MD   Chief Complaint  Patient presents with  . Fever    started on Monday  . Cough  . Shortness of Breath   Subjective:   Delories Heinz presents with runny nose, sneezing, cough, sore throat, malaise, myalgias, arthralgia, chills, and fever.  Multiple recent exposure to others with similar symptoms.   The patent denies sore throat as the primary complaint. Denies sthortness of breath/wheezing, otalgia, facial pain, abdominal pain, changes in bowel or bladder.  Generally feels terrible  Tmax: 102  PMH, PHS, Allergies, Problem List, Medications, Family History, and Social History have all been reviewed.  Patient Active Problem List   Diagnosis Date Noted  . Generalized anxiety disorder 12/11/2016  . Family history of valvular heart disease 11/22/2016  . CROHN'S [facility] INTESTINE 05/26/2010  . IRRITABLE BOWEL SYNDROME 09/25/2007    Past Medical History:  Diagnosis Date  . Crohn's disease ([place])   . Irritable bowel syndrome   . 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 COLONOSCOPY  7/11   Dr. Ardis Hughs  . TONSILLECTOMY AND ADENOIDECTOMY  1990's    Social History   Socioeconomic History  . Marital status: Married    Spouse name: Danae Chen  . Number of children: 2  . Years of education: Masters  . Highest education level: Not on file  Social Needs  . Financial resource strain: Not on file  . Food insecurity - worry: Not on file  . Food insecurity - inability: Not on file  . Transportation needs - medical: Not on file  . Transportation needs - non-medical: Not on file  Occupational History  .  Occupation: Pastor  Tobacco Use  . Smoking status: Never Smoker  . Smokeless tobacco: Never Used  Substance and Sexual Activity  . Alcohol use: No    Alcohol/week: 0.0 oz  . Drug use: No  . Sexual activity: Not on file  Other Topics Concern  . Not on file  Social History Narrative   Married      1 son      Youth minister; [employer]          Family History  Problem Relation Age of Onset  . Healthy Mother   . Healthy Father   . Crohn's disease Cousin   . Hypertension Unknown        family hx  . Colon cancer Maternal Grandfather   . Colon polyps Maternal Grandfather   . Coronary artery disease Neg Hx   . Diabetes Neg Hx     Allergies  Allergen Reactions  . Amoxicillin Rash    Medication list reviewed and updated in full in [place].  ROS as above, eating and drinking - tolerating PO. Urinating normally. No excessive vomitting or diarrhea.   Objective:   Blood pressure 108/70, pulse 94, temperature (!) 102.4 F (39.1 C), temperature source Oral, height 5' 11.5" (1.816 m), weight 195 lb 4 oz (88.6 kg).  Gen: WDWN, NAD; A & O x3, cooperative. Pleasant.Globally Non-toxic HEENT: Normocephalic and atraumatic. Throat clear, w/o exudate, R TM clear, L TM - good landmarks, No fluid present. rhinnorhea. No frontal or maxillary sinus  T. MMM NECK: Anterior cervical  LAD is absent CV: RRR, No M/G/R, cap refill <2 sec PULM: Breathing comfortably in no respiratory distress. no wheezing, crackles, rhonchi ABD: S,NT,ND,+BS. No HSM. No rebound. EXT: No c/c/e PSYCH: Friendly, good eye contact MSK: Nml gait  Results for orders placed or performed in visit on 08/01/17  POC Influenza A&B (Binax test)  Result Value Ref Range   Influenza A, POC Positive (A) Negative   Influenza B, POC Negative Negative    Assessment and Plan:   Influenza A  Fever, unspecified fever cause - Plan: POC Influenza A&B (Binax test)  The patient's clinical exam and history is  consistent with a diagnosis of influenza.  Supportive care, fluids, cough medicines as needed, and anti-pyretics. Infection control emphasized, including OOW or school until AF 24 hours.  Follow-up: No Follow-up on file.  Meds ordered this encounter  Medications  . oseltamivir (TAMIFLU) 75 MG capsule    Sig: Take 1 capsule (75 mg total) by mouth 2 (two) times daily.    Dispense:  10 capsule    Refill:  0  . albuterol (PROVENTIL HFA;VENTOLIN HFA) 108 (90 Base) MCG/ACT inhaler    Sig: Inhale 2 puffs into the lungs every 6 (six) hours as needed for wheezing or shortness of breath.    Dispense:  1 Inhaler    Refill:  0   Orders Placed This Encounter  Procedures  . POC Influenza A&B (Binax test)    Signed,  Jalisa Sacco T. Camellia Popescu, MD   Patient's Medications  New Prescriptions   ALBUTEROL (PROVENTIL HFA;VENTOLIN HFA) 108 (90 BASE) MCG/ACT INHALER    Inhale 2 puffs into the lungs every 6 (six) hours as needed for wheezing or shortness of breath.   OSELTAMIVIR (TAMIFLU) 75 MG CAPSULE    Take 1 capsule (75 mg total) by mouth 2 (two) times daily.  Previous Medications   PAROXETINE (PAXIL) 20 MG TABLET    Take 1 tablet (20 mg total) by mouth daily.  Modified Medications   No medications on file  Discontinued Medications   AZITHROMYCIN (ZITHROMAX) 250 MG TABLET    Take by mouth daily.   ESOMEPRAZOLE MAGNESIUM (NEXIUM PO)    Take 1 capsule by mouth daily. OTC   ESZOPICLONE (LUNESTA) 2 MG TABS TABLET    TAKE 1 TABLET BY MOUTH EVERY NIGHT AT BEDTIME AS NEEDED FOR SLEEP. TAKE IMMEDIATELY BEFORE BEDTIME.   LEVOFLOXACIN (LEVAQUIN) 500 MG TABLET    Take 1 tablet (500 mg total) by mouth daily.   ONDANSETRON (ZOFRAN-ODT) 4 MG DISINTEGRATING TABLET    Take 1 tablet (4 mg total) by mouth every 8 (eight) hours as needed for nausea or vomiting.

## 2017-08-03 ENCOUNTER — Ambulatory Visit: Payer: Self-pay

## 2017-08-03 ENCOUNTER — Encounter: Payer: Self-pay | Admitting: Family Medicine

## 2017-08-03 ENCOUNTER — Ambulatory Visit: Payer: BLUE CROSS/BLUE SHIELD | Admitting: Family Medicine

## 2017-08-03 ENCOUNTER — Ambulatory Visit (INDEPENDENT_AMBULATORY_CARE_PROVIDER_SITE_OTHER)
Admission: RE | Admit: 2017-08-03 | Discharge: 2017-08-03 | Disposition: A | Discharge status: Home / Self Care | Payer: BLUE CROSS/BLUE SHIELD | Source: Ambulatory Visit | Attending: Family Medicine | Admitting: Family Medicine

## 2017-08-03 VITALS — BP 92/70 | HR 84 | Temp 97.8°F | Ht 71.5 in | Wt 193.2 lb

## 2017-08-03 DIAGNOSIS — J101 Influenza due to other identified influenza virus with other respiratory manifestations: Secondary | ICD-10-CM

## 2017-08-03 DIAGNOSIS — R0600 Dyspnea, unspecified: Secondary | ICD-10-CM

## 2017-08-03 DIAGNOSIS — R0602 Shortness of breath: Secondary | ICD-10-CM | POA: Diagnosis not present

## 2017-08-03 DIAGNOSIS — R0609 Other forms of dyspnea: Secondary | ICD-10-CM

## 2017-08-03 NOTE — Assessment & Plan Note (Addendum)
Likely du to flu, needing more time to recover. Pt nontoxic. Will eval CXR and check O2Sat with exertion.    Nml O2 sat with exertion and nml CXR.   Likely symptoms of flu.. Supportive care.

## 2017-08-03 NOTE — Patient Instructions (Addendum)
Chest X-ray is clear!  Rest, fluids and time.  Expect 7-10 days of illness, should continue to turn corner.

## 2017-08-03 NOTE — Telephone Encounter (Signed)
Pt.'s wife called to report pt. Is still coughing, non-productive,using inhaler without relief. C/o chest tightness and pain. Wife concerned about the possibility of getting pneumonia. Reason for Disposition . [1] Continuous (nonstop) coughing interferes with work or school AND [2] no improvement using cough treatment per protocol  Answer Assessment - Initial Assessment Questions 1. ONSET: "When did the cough begin?"      Started on [place] 2. SEVERITY: "How bad is the cough today?"      Moderate 3. RESPIRATORY DISTRESS: "Describe your breathing."      Hurts to take a deep breath 4. FEVER: "Do you have a fever?" If so, ask: "What is your temperature, how was it measured, and when did it start?"     No 5. HEMOPTYSIS: "Are you coughing up any blood?" If so ask: "How much?" (flecks, streaks, tablespoons, etc.)     No 6. TREATMENT: "What have you done so far to treat the cough?" (e.g., meds, fluids, humidifier)     Using Pro air inhaler 7. CARDIAC HISTORY: "Do you have any history of heart disease?" (e.g., heart attack, congestive heart failure)      No 8. LUNG HISTORY: "Do you have any history of lung disease?"  (e.g., pulmonary embolus, asthma, emphysema)      No 9. PE RISK FACTORS: "Do you have a history of blood clots?" (or: recent major surgery, recent prolonged travel, bedridden )     No 10. OTHER SYMPTOMS: "Do you have any other symptoms? (e.g., runny nose, wheezing, chest pain)       Achy, Dizziness when standing 11. PREGNANCY: "Is there any chance you are pregnant?" "When was your last menstrual period?"       No 12. TRAVEL: "Have you traveled out of the country in the last month?" (e.g., travel history, exposures)       No  Protocols used: COUGH - ACUTE NON-PRODUCTIVE-A-AH

## 2017-08-03 NOTE — Progress Notes (Signed)
   Subjective:    Patient ID: Russell [name], male    DOB: 1986/05/18, 32 y.o.   MRN: [id number]  Cough  Associated symptoms include shortness of breath.  Shortness of Breath    Diagnosed early in week on (2/20) with the flu.Marland Kitchen Now with continued cough and shortness of breath.  Started on tamiflu, given albuterol as needed.   Cough is minimal. Headache is better.  He is now feeling better, but is increasingly short of breath and having cold sweats. Dyspnea with exertion.  Occ wheezing.  Have not measured fever. Using dayquil, tylenol and Advil regularly. Albuterol does not help much with SOB, maybe a little..   Not immunosupressed.   no asthma, no nonsmoker.  Blood pressure 92/70, pulse 84, temperature 97.8 F (36.6 C), temperature source Oral, height 5' 11.5" (1.816 m), weight 193 lb 4 oz (87.7 kg), SpO2 98 %.  Review of Systems  Respiratory: Positive for cough and shortness of breath.    No history of COPD or asthma, nonsmoker    Objective:   Physical Exam  Constitutional: Vital signs are normal. He appears well-developed and well-nourished.  Non-toxic appearance. He does not appear ill. No distress.  HENT:  Head: Normocephalic and atraumatic.  Right Ear: Hearing, tympanic membrane, external ear and ear canal normal. No tenderness. No foreign bodies. Tympanic membrane is not retracted and not bulging.  Left Ear: Hearing, tympanic membrane, external ear and ear canal normal. No tenderness. No foreign bodies. Tympanic membrane is not retracted and not bulging.  Nose: Nose normal. No mucosal edema or rhinorrhea. Right sinus exhibits no maxillary sinus tenderness and no frontal sinus tenderness. Left sinus exhibits no maxillary sinus tenderness and no frontal sinus tenderness.  Mouth/Throat: Uvula is midline, oropharynx is clear and moist and mucous membranes are normal. Normal dentition. No dental caries. No oropharyngeal exudate or tonsillar abscesses.  Eyes: Conjunctivae, EOM  and lids are normal. Pupils are equal, round, and reactive to light. Lids are everted and swept, no foreign bodies found.  Neck: Trachea normal, normal range of motion and phonation normal. Neck supple. Carotid bruit is not present. No thyroid mass and no thyromegaly present.  Cardiovascular: Normal rate, regular rhythm, S1 normal, S2 normal, normal heart sounds, intact distal pulses and normal pulses. Exam reveals no gallop.  No murmur heard. Pulmonary/Chest: Effort normal and breath sounds normal. No respiratory distress. He has no wheezes. He has no rhonchi. He has no rales.  Abdominal: Soft. Normal appearance and bowel sounds are normal. There is no hepatosplenomegaly. There is no tenderness. There is no rebound, no guarding and no CVA tenderness. No hernia.  Neurological: He is alert. He has normal reflexes.  Skin: Skin is warm, dry and intact. No rash noted.  Psychiatric: He has a normal mood and affect. His speech is normal and behavior is normal. Judgment normal.          Assessment & Plan:

## 2017-08-08 DIAGNOSIS — M5386 Other specified dorsopathies, lumbar region: Secondary | ICD-10-CM | POA: Diagnosis not present

## 2017-08-08 DIAGNOSIS — M9902 Segmental and somatic dysfunction of thoracic region: Secondary | ICD-10-CM | POA: Diagnosis not present

## 2017-08-08 DIAGNOSIS — M9901 Segmental and somatic dysfunction of cervical region: Secondary | ICD-10-CM | POA: Diagnosis not present

## 2017-08-08 DIAGNOSIS — M9903 Segmental and somatic dysfunction of lumbar region: Secondary | ICD-10-CM | POA: Diagnosis not present

## 2017-08-27 DIAGNOSIS — M9901 Segmental and somatic dysfunction of cervical region: Secondary | ICD-10-CM | POA: Diagnosis not present

## 2017-08-27 DIAGNOSIS — M5386 Other specified dorsopathies, lumbar region: Secondary | ICD-10-CM | POA: Diagnosis not present

## 2017-08-27 DIAGNOSIS — M9902 Segmental and somatic dysfunction of thoracic region: Secondary | ICD-10-CM | POA: Diagnosis not present

## 2017-08-27 DIAGNOSIS — M9903 Segmental and somatic dysfunction of lumbar region: Secondary | ICD-10-CM | POA: Diagnosis not present

## 2017-08-28 ENCOUNTER — Other Ambulatory Visit: Payer: Self-pay | Admitting: Family Medicine

## 2017-09-04 ENCOUNTER — Ambulatory Visit: Payer: BLUE CROSS/BLUE SHIELD | Admitting: Cardiovascular Disease

## 2017-09-26 DIAGNOSIS — M9902 Segmental and somatic dysfunction of thoracic region: Secondary | ICD-10-CM | POA: Diagnosis not present

## 2017-09-26 DIAGNOSIS — M9903 Segmental and somatic dysfunction of lumbar region: Secondary | ICD-10-CM | POA: Diagnosis not present

## 2017-09-26 DIAGNOSIS — M5386 Other specified dorsopathies, lumbar region: Secondary | ICD-10-CM | POA: Diagnosis not present

## 2017-09-26 DIAGNOSIS — M9901 Segmental and somatic dysfunction of cervical region: Secondary | ICD-10-CM | POA: Diagnosis not present

## 2017-10-17 DIAGNOSIS — M9901 Segmental and somatic dysfunction of cervical region: Secondary | ICD-10-CM | POA: Diagnosis not present

## 2017-10-17 DIAGNOSIS — M9903 Segmental and somatic dysfunction of lumbar region: Secondary | ICD-10-CM | POA: Diagnosis not present

## 2017-10-17 DIAGNOSIS — M5386 Other specified dorsopathies, lumbar region: Secondary | ICD-10-CM | POA: Diagnosis not present

## 2017-10-17 DIAGNOSIS — M9902 Segmental and somatic dysfunction of thoracic region: Secondary | ICD-10-CM | POA: Diagnosis not present

## 2017-10-22 DIAGNOSIS — M5386 Other specified dorsopathies, lumbar region: Secondary | ICD-10-CM | POA: Diagnosis not present

## 2017-10-22 DIAGNOSIS — M9901 Segmental and somatic dysfunction of cervical region: Secondary | ICD-10-CM | POA: Diagnosis not present

## 2017-10-22 DIAGNOSIS — M9902 Segmental and somatic dysfunction of thoracic region: Secondary | ICD-10-CM | POA: Diagnosis not present

## 2017-10-22 DIAGNOSIS — M9903 Segmental and somatic dysfunction of lumbar region: Secondary | ICD-10-CM | POA: Diagnosis not present

## 2017-10-25 DIAGNOSIS — M9902 Segmental and somatic dysfunction of thoracic region: Secondary | ICD-10-CM | POA: Diagnosis not present

## 2017-10-25 DIAGNOSIS — M9903 Segmental and somatic dysfunction of lumbar region: Secondary | ICD-10-CM | POA: Diagnosis not present

## 2017-10-25 DIAGNOSIS — M5386 Other specified dorsopathies, lumbar region: Secondary | ICD-10-CM | POA: Diagnosis not present

## 2017-10-25 DIAGNOSIS — M9901 Segmental and somatic dysfunction of cervical region: Secondary | ICD-10-CM | POA: Diagnosis not present

## 2017-10-31 ENCOUNTER — Other Ambulatory Visit: Payer: Self-pay | Admitting: Family Medicine

## 2017-11-12 DIAGNOSIS — M5386 Other specified dorsopathies, lumbar region: Secondary | ICD-10-CM | POA: Diagnosis not present

## 2017-11-12 DIAGNOSIS — M9902 Segmental and somatic dysfunction of thoracic region: Secondary | ICD-10-CM | POA: Diagnosis not present

## 2017-11-12 DIAGNOSIS — M9903 Segmental and somatic dysfunction of lumbar region: Secondary | ICD-10-CM | POA: Diagnosis not present

## 2017-11-12 DIAGNOSIS — M9901 Segmental and somatic dysfunction of cervical region: Secondary | ICD-10-CM | POA: Diagnosis not present

## 2017-11-19 ENCOUNTER — Other Ambulatory Visit: Payer: Self-pay | Admitting: Family Medicine

## 2017-11-19 DIAGNOSIS — Z0000 Encounter for general adult medical examination without abnormal findings: Secondary | ICD-10-CM

## 2017-11-22 ENCOUNTER — Other Ambulatory Visit (INDEPENDENT_AMBULATORY_CARE_PROVIDER_SITE_OTHER): Payer: BLUE CROSS/BLUE SHIELD

## 2017-11-22 DIAGNOSIS — Z113 Encounter for screening for infections with a predominantly sexual mode of transmission: Secondary | ICD-10-CM

## 2017-11-22 DIAGNOSIS — Z0000 Encounter for general adult medical examination without abnormal findings: Secondary | ICD-10-CM

## 2017-11-22 LAB — HEPATIC FUNCTION PANEL
ALT: 21 U/L (ref 0–53)
AST: 18 U/L (ref 0–37)
Albumin: 4.7 g/dL (ref 3.5–5.2)
Alkaline Phosphatase: 37 U/L — ABNORMAL LOW (ref 39–117)
BILIRUBIN DIRECT: 0.1 mg/dL (ref 0.0–0.3)
BILIRUBIN TOTAL: 0.6 mg/dL (ref 0.2–1.2)
TOTAL PROTEIN: 7.2 g/dL (ref 6.0–8.3)

## 2017-11-22 LAB — LIPID PANEL
CHOL/HDL RATIO: 4
CHOLESTEROL: 194 mg/dL (ref 0–200)
HDL: 45.2 mg/dL (ref >39.00)
LDL CALC: 131 mg/dL — AB (ref 0–99)
NonHDL: 148.37
Triglycerides: 87 mg/dL (ref 0.0–149.0)
VLDL: 17.4 mg/dL (ref 0.0–40.0)

## 2017-11-22 LAB — CBC WITH DIFFERENTIAL/PLATELET
BASOS ABS: 0 10*3/uL (ref 0.0–0.1)
Basophils Relative: 0.7 % (ref 0.0–3.0)
EOS ABS: 0.2 10*3/uL (ref 0.0–0.7)
Eosinophils Relative: 3.3 % (ref 0.0–5.0)
HCT: 44.2 % (ref 39.0–52.0)
Hemoglobin: 15.4 g/dL (ref 13.0–17.0)
Lymphocytes Relative: 29.4 % (ref 12.0–46.0)
Lymphs Abs: 1.8 10*3/uL (ref 0.7–4.0)
MCHC: 34.8 g/dL (ref 30.0–36.0)
MCV: 86.8 fl (ref 78.0–100.0)
MONO ABS: 0.5 10*3/uL (ref 0.1–1.0)
Monocytes Relative: 7.8 % (ref 3.0–12.0)
NEUTROS ABS: 3.6 10*3/uL (ref 1.4–7.7)
NEUTROS PCT: 58.8 % (ref 43.0–77.0)
PLATELETS: 271 10*3/uL (ref 150.0–400.0)
RBC: 5.09 Mil/uL (ref 4.22–5.81)
RDW: 13 % (ref 11.5–15.5)
WBC: 6.1 10*3/uL (ref 4.0–10.5)

## 2017-11-22 LAB — BASIC METABOLIC PANEL
BUN: 13 mg/dL (ref 6–23)
CHLORIDE: 102 meq/L (ref 96–112)
CO2: 32 meq/L (ref 19–32)
Calcium: 9.8 mg/dL (ref 8.4–10.5)
Creatinine, Ser: 1.07 mg/dL (ref 0.40–1.50)
GFR: 84.3 mL/min (ref >60.00)
GLUCOSE: 97 mg/dL (ref 70–99)
POTASSIUM: 4.7 meq/L (ref 3.5–5.1)
Sodium: 141 mEq/L (ref 135–145)

## 2017-11-23 LAB — HIV ANTIBODY (ROUTINE TESTING W REFLEX): HIV: NONREACTIVE

## 2017-11-26 DIAGNOSIS — M9901 Segmental and somatic dysfunction of cervical region: Secondary | ICD-10-CM | POA: Diagnosis not present

## 2017-11-26 DIAGNOSIS — M9903 Segmental and somatic dysfunction of lumbar region: Secondary | ICD-10-CM | POA: Diagnosis not present

## 2017-11-26 DIAGNOSIS — M9902 Segmental and somatic dysfunction of thoracic region: Secondary | ICD-10-CM | POA: Diagnosis not present

## 2017-11-26 DIAGNOSIS — M5386 Other specified dorsopathies, lumbar region: Secondary | ICD-10-CM | POA: Diagnosis not present

## 2017-11-29 ENCOUNTER — Encounter: Payer: Self-pay | Admitting: Family Medicine

## 2017-11-29 ENCOUNTER — Ambulatory Visit (INDEPENDENT_AMBULATORY_CARE_PROVIDER_SITE_OTHER): Payer: BLUE CROSS/BLUE SHIELD | Admitting: Family Medicine

## 2017-11-29 ENCOUNTER — Other Ambulatory Visit: Payer: Self-pay

## 2017-11-29 VITALS — BP 106/64 | HR 77 | Temp 98.7°F | Ht 71.5 in | Wt 204.2 lb

## 2017-11-29 DIAGNOSIS — Z0000 Encounter for general adult medical examination without abnormal findings: Secondary | ICD-10-CM | POA: Diagnosis not present

## 2017-11-29 NOTE — Progress Notes (Signed)
Dr. Frederico Hamman T. , MD, [facility] [place], [address] Phone: [phone] Fax: [phone]  11/29/2017  Patient: Russell [name], MRN: [id number], DOB: 1986/01/02, 32 y.o.  Primary Physician:  Owens Loffler, MD   Chief Complaint  Patient presents with  . Annual Exam   Subjective:   DEBBIE BELLUCCI is a 32 y.o. pleasant patient who presents with the following:  Preventative Health Maintenance Visit:  Health Maintenance Summary Reviewed and updated, unless pt declines services.  Tobacco History Reviewed. Alcohol: No concerns, no excessive use Exercise Habits: Some activity, rec at least 30 mins 5 times a week STD concerns: no risk or activity to increase risk Drug Use: None Encouraged self-testicular check  Walks every morning on the treadmill, at least two miles.   Health Maintenance  Topic Date Due  . INFLUENZA VACCINE  01/10/2018  . TETANUS/TDAP  12/20/2026  . HIV Screening  Completed   Immunization History  Administered Date(s) Administered  . Td 10/01/2000  . Tdap 12/19/2016   Patient Active Problem List   Diagnosis Date Noted  . Generalized anxiety disorder 12/11/2016  . Family history of valvular heart disease 11/22/2016  . CROHN'S [facility] INTESTINE 05/26/2010  . IRRITABLE BOWEL SYNDROME 09/25/2007   Past Medical History:  Diagnosis Date  . Crohn's disease ([place])   . Irritable bowel syndrome   . Regional enteritis of small intestine ([place]) 7/11   Crohn's ileitis   Past Surgical History:  Procedure Laterality Date  . COLONOSCOPY  7/11   Dr. Ardis Hughs  . TONSILLECTOMY AND ADENOIDECTOMY  1990's   Social History   Socioeconomic History  . Marital status: Married    Spouse name: Danae Chen  . Number of children: 2  . Years of education: Masters  . Highest education level: Not on file  Occupational History  . Occupation: Theme park manager  Social Needs  . Financial resource strain: Not  on file  . Food insecurity:    Worry: Not on file    Inability: Not on file  . Transportation needs:    Medical: Not on file    Non-medical: Not on file  Tobacco Use  . Smoking status: Never Smoker  . Smokeless tobacco: Never Used  Substance and Sexual Activity  . Alcohol use: No    Alcohol/week: 0.0 oz  . Drug use: No  . Sexual activity: Not on file  Lifestyle  . Physical activity:    Days per week: Not on file    Minutes per session: Not on file  . Stress: Not on file  Relationships  . Social connections:    Talks on phone: Not on file    Gets together: Not on file    Attends religious service: Not on file    Active member of club or organization: Not on file    Attends meetings of clubs or organizations: Not on file    Relationship status: Not on file  . Intimate partner violence:    Fear of current or ex partner: Not on file    Emotionally abused: Not on file    Physically abused: Not on file    Forced sexual activity: Not on file  Other Topics Concern  . Not on file  Social History Narrative   Married      1 son      Youth minister; [employer]         Family History  Problem Relation Age of Onset  .  Healthy Mother   . Healthy Father   . Crohn's disease Cousin   . Hypertension Unknown        family hx  . Colon cancer Maternal Grandfather   . Colon polyps Maternal Grandfather   . Coronary artery disease Neg Hx   . Diabetes Neg Hx    Allergies  Allergen Reactions  . Amoxicillin Rash    Medication list has been reviewed and updated.   General: Denies fever, chills, sweats. No significant weight loss. Eyes: Denies blurring,significant itching ENT: Denies earache, sore throat, and hoarseness. Cardiovascular: Denies chest pains, palpitations, dyspnea on exertion Respiratory: Denies cough, dyspnea at rest,wheeezing Breast: no concerns about lumps GI: Denies nausea, vomiting, diarrhea, constipation, change in bowel habits, abdominal pain,  melena, hematochezia GU: Denies penile discharge, ED, urinary flow / outflow problems. No STD concerns. Musculoskeletal: Denies back pain, joint pain Derm: Denies rash, itching Neuro: Denies  paresthesias, frequent falls, frequent headaches Psych: Denies depression, anxiety Endocrine: Denies cold intolerance, heat intolerance, polydipsia Heme: Denies enlarged lymph nodes Allergy: No hayfever  Objective:   BP 106/64   Pulse 77   Temp 98.7 F (37.1 C) (Oral)   Ht 5' 11.5" (1.816 m)   Wt 204 lb 4 oz (92.6 kg)   BMI 28.09 kg/m  Ideal Body Weight: Weight in (lb) to have BMI = 25: 181.4  No exam data present  GEN: well developed, well nourished, no acute distress Eyes: conjunctiva and lids normal, PERRLA, EOMI ENT: TM clear, nares clear, oral exam WNL Neck: supple, no lymphadenopathy, no thyromegaly, no JVD Pulm: clear to auscultation and percussion, respiratory effort normal CV: regular rate and rhythm, S1-S2, no murmur, rub or gallop, no bruits, peripheral pulses normal and symmetric, no cyanosis, clubbing, edema or varicosities GI: soft, non-tender; no hepatosplenomegaly, masses; active bowel sounds all quadrants GU: no hernia, testicular mass, penile discharge Lymph: no cervical, axillary or inguinal adenopathy MSK: gait normal, muscle tone and strength WNL, no joint swelling, effusions, discoloration, crepitus  SKIN: clear, good turgor, color WNL, no rashes, lesions, or ulcerations Neuro: normal mental status, normal strength, sensation, and motion Psych: alert; oriented to person, place and time, normally interactive and not anxious or depressed in appearance. All labs reviewed with patient.  Lipids:    Component Value Date/Time   CHOL 194 11/22/2017 0837   TRIG 87.0 11/22/2017 0837   HDL 45.20 11/22/2017 0837   VLDL 17.4 11/22/2017 0837   CHOLHDL 4 11/22/2017 0837   CBC: CBC Latest Ref Rng & Units 11/22/2017 10/26/2016 12/17/2015  WBC 4.0 - 10.5 K/uL 6.1 7.7 7.1    Hemoglobin 13.0 - 17.0 g/dL 15.4 15.2 15.1  Hematocrit 39.0 - 52.0 % 44.2 44.4 43.8  Platelets 150.0 - 400.0 K/uL 271.0 259.0 093.8    Basic Metabolic Panel:    Component Value Date/Time   NA 141 11/22/2017 0837   K 4.7 11/22/2017 0837   CL 102 11/22/2017 0837   CO2 32 11/22/2017 0837   BUN 13 11/22/2017 0837   CREATININE 1.07 11/22/2017 0837   GLUCOSE 97 11/22/2017 0837   CALCIUM 9.8 11/22/2017 0837   Hepatic Function Latest Ref Rng & Units 11/22/2017 10/26/2016 12/17/2015  Total Protein 6.0 - 8.3 g/dL 7.2 7.1 7.1  Albumin 3.5 - 5.2 g/dL 4.7 4.8 4.7  AST 0 - 37 U/L _0 ALT 0 - 53 U/L _1 Alk Phosphatase 39 - 117 U/L 37(L) 31(L) 35(L)  Total Bilirubin 0.2 - 1.2 mg/dL 0.6  0.6 0.6  Bilirubin, Direct 0.0 - 0.3 mg/dL 0.1 - -    Lab Results  Component Value Date   TSH 1.07 10/26/2016   No results found for: PSA  Assessment and Plan:   Routine general medical examination at a health care facility  He has gained about 15 pounds, so he wants to try going off of his Paxil, so we are going to do titrate him off of it.  If he has recurrence of symptoms, then we can always try something else for his anxiety.  Health Maintenance Exam: The patient's preventative maintenance and recommended screening tests for an annual wellness exam were reviewed in full today. Brought up to date unless services declined.  Counselled on the importance of diet, exercise, and its role in overall health and mortality. The patient's FH and SH was reviewed, including their home life, tobacco status, and drug and alcohol status.  Follow-up in 1 year for physical exam or additional follow-up below.  Follow-up: No follow-ups on file. Or follow-up in 1 year if not noted.  Signed,  Maud Deed. Colin Norment, MD   Allergies as of 11/29/2017      Reactions   Amoxicillin Rash      Medication List        Accurate as of 11/29/17 10:46 AM. Always use your most recent med list.           PARoxetine 20 MG tablet Commonly known as:  PAXIL TAKE 1 TABLET BY MOUTH DAILY

## 2017-11-29 NOTE — Patient Instructions (Signed)
Cut back to 1/2 tablet of Paxil each day and continue doing that for 2 weeks, then stop.

## 2017-12-04 DIAGNOSIS — M9903 Segmental and somatic dysfunction of lumbar region: Secondary | ICD-10-CM | POA: Diagnosis not present

## 2017-12-04 DIAGNOSIS — M9901 Segmental and somatic dysfunction of cervical region: Secondary | ICD-10-CM | POA: Diagnosis not present

## 2017-12-04 DIAGNOSIS — M5386 Other specified dorsopathies, lumbar region: Secondary | ICD-10-CM | POA: Diagnosis not present

## 2017-12-04 DIAGNOSIS — M9902 Segmental and somatic dysfunction of thoracic region: Secondary | ICD-10-CM | POA: Diagnosis not present

## 2017-12-17 DIAGNOSIS — M9903 Segmental and somatic dysfunction of lumbar region: Secondary | ICD-10-CM | POA: Diagnosis not present

## 2017-12-17 DIAGNOSIS — M5386 Other specified dorsopathies, lumbar region: Secondary | ICD-10-CM | POA: Diagnosis not present

## 2018-01-01 DIAGNOSIS — M5386 Other specified dorsopathies, lumbar region: Secondary | ICD-10-CM | POA: Diagnosis not present

## 2018-01-01 DIAGNOSIS — M9902 Segmental and somatic dysfunction of thoracic region: Secondary | ICD-10-CM | POA: Diagnosis not present

## 2018-01-01 DIAGNOSIS — M9901 Segmental and somatic dysfunction of cervical region: Secondary | ICD-10-CM | POA: Diagnosis not present

## 2018-01-24 DIAGNOSIS — M9901 Segmental and somatic dysfunction of cervical region: Secondary | ICD-10-CM | POA: Diagnosis not present

## 2018-01-24 DIAGNOSIS — M5386 Other specified dorsopathies, lumbar region: Secondary | ICD-10-CM | POA: Diagnosis not present

## 2018-01-24 DIAGNOSIS — M9903 Segmental and somatic dysfunction of lumbar region: Secondary | ICD-10-CM | POA: Diagnosis not present

## 2018-01-31 DIAGNOSIS — M5386 Other specified dorsopathies, lumbar region: Secondary | ICD-10-CM | POA: Diagnosis not present

## 2018-01-31 DIAGNOSIS — M9903 Segmental and somatic dysfunction of lumbar region: Secondary | ICD-10-CM | POA: Diagnosis not present

## 2018-01-31 DIAGNOSIS — M9902 Segmental and somatic dysfunction of thoracic region: Secondary | ICD-10-CM | POA: Diagnosis not present

## 2018-01-31 DIAGNOSIS — M9901 Segmental and somatic dysfunction of cervical region: Secondary | ICD-10-CM | POA: Diagnosis not present

## 2018-02-12 DIAGNOSIS — M9901 Segmental and somatic dysfunction of cervical region: Secondary | ICD-10-CM | POA: Diagnosis not present

## 2018-02-12 DIAGNOSIS — M9902 Segmental and somatic dysfunction of thoracic region: Secondary | ICD-10-CM | POA: Diagnosis not present

## 2018-02-12 DIAGNOSIS — M5386 Other specified dorsopathies, lumbar region: Secondary | ICD-10-CM | POA: Diagnosis not present

## 2018-02-12 DIAGNOSIS — M9903 Segmental and somatic dysfunction of lumbar region: Secondary | ICD-10-CM | POA: Diagnosis not present

## 2018-03-07 DIAGNOSIS — M9903 Segmental and somatic dysfunction of lumbar region: Secondary | ICD-10-CM | POA: Diagnosis not present

## 2018-03-07 DIAGNOSIS — M5386 Other specified dorsopathies, lumbar region: Secondary | ICD-10-CM | POA: Diagnosis not present

## 2018-04-04 DIAGNOSIS — J209 Acute bronchitis, unspecified: Secondary | ICD-10-CM | POA: Diagnosis not present

## 2018-04-08 DIAGNOSIS — M9901 Segmental and somatic dysfunction of cervical region: Secondary | ICD-10-CM | POA: Diagnosis not present

## 2018-04-08 DIAGNOSIS — M9902 Segmental and somatic dysfunction of thoracic region: Secondary | ICD-10-CM | POA: Diagnosis not present

## 2018-04-08 DIAGNOSIS — M9903 Segmental and somatic dysfunction of lumbar region: Secondary | ICD-10-CM | POA: Diagnosis not present

## 2018-04-08 DIAGNOSIS — M5386 Other specified dorsopathies, lumbar region: Secondary | ICD-10-CM | POA: Diagnosis not present

## 2018-04-18 DIAGNOSIS — M9902 Segmental and somatic dysfunction of thoracic region: Secondary | ICD-10-CM | POA: Diagnosis not present

## 2018-04-18 DIAGNOSIS — M5386 Other specified dorsopathies, lumbar region: Secondary | ICD-10-CM | POA: Diagnosis not present

## 2018-04-18 DIAGNOSIS — M9903 Segmental and somatic dysfunction of lumbar region: Secondary | ICD-10-CM | POA: Diagnosis not present

## 2018-04-30 DIAGNOSIS — M9901 Segmental and somatic dysfunction of cervical region: Secondary | ICD-10-CM | POA: Diagnosis not present

## 2018-04-30 DIAGNOSIS — M9903 Segmental and somatic dysfunction of lumbar region: Secondary | ICD-10-CM | POA: Diagnosis not present

## 2018-04-30 DIAGNOSIS — M9902 Segmental and somatic dysfunction of thoracic region: Secondary | ICD-10-CM | POA: Diagnosis not present

## 2018-04-30 DIAGNOSIS — M5386 Other specified dorsopathies, lumbar region: Secondary | ICD-10-CM | POA: Diagnosis not present

## 2018-05-01 DIAGNOSIS — J0100 Acute maxillary sinusitis, unspecified: Secondary | ICD-10-CM | POA: Diagnosis not present

## 2018-05-14 DIAGNOSIS — M9903 Segmental and somatic dysfunction of lumbar region: Secondary | ICD-10-CM | POA: Diagnosis not present

## 2018-05-14 DIAGNOSIS — M5386 Other specified dorsopathies, lumbar region: Secondary | ICD-10-CM | POA: Diagnosis not present

## 2018-05-14 DIAGNOSIS — M9902 Segmental and somatic dysfunction of thoracic region: Secondary | ICD-10-CM | POA: Diagnosis not present

## 2018-05-14 DIAGNOSIS — M9901 Segmental and somatic dysfunction of cervical region: Secondary | ICD-10-CM | POA: Diagnosis not present

## 2018-05-28 DIAGNOSIS — M9903 Segmental and somatic dysfunction of lumbar region: Secondary | ICD-10-CM | POA: Diagnosis not present

## 2018-05-28 DIAGNOSIS — M9902 Segmental and somatic dysfunction of thoracic region: Secondary | ICD-10-CM | POA: Diagnosis not present

## 2018-05-28 DIAGNOSIS — M5386 Other specified dorsopathies, lumbar region: Secondary | ICD-10-CM | POA: Diagnosis not present

## 2018-05-28 DIAGNOSIS — M9901 Segmental and somatic dysfunction of cervical region: Secondary | ICD-10-CM | POA: Diagnosis not present

## 2018-06-11 DIAGNOSIS — M5386 Other specified dorsopathies, lumbar region: Secondary | ICD-10-CM | POA: Diagnosis not present

## 2018-06-11 DIAGNOSIS — M9903 Segmental and somatic dysfunction of lumbar region: Secondary | ICD-10-CM | POA: Diagnosis not present

## 2018-06-11 DIAGNOSIS — M9902 Segmental and somatic dysfunction of thoracic region: Secondary | ICD-10-CM | POA: Diagnosis not present

## 2018-06-11 DIAGNOSIS — M9901 Segmental and somatic dysfunction of cervical region: Secondary | ICD-10-CM | POA: Diagnosis not present

## 2018-06-24 ENCOUNTER — Telehealth: Payer: Self-pay | Admitting: Family Medicine

## 2018-06-24 DIAGNOSIS — M9903 Segmental and somatic dysfunction of lumbar region: Secondary | ICD-10-CM | POA: Diagnosis not present

## 2018-06-24 DIAGNOSIS — M9901 Segmental and somatic dysfunction of cervical region: Secondary | ICD-10-CM | POA: Diagnosis not present

## 2018-06-24 DIAGNOSIS — M5386 Other specified dorsopathies, lumbar region: Secondary | ICD-10-CM | POA: Diagnosis not present

## 2018-06-24 DIAGNOSIS — M9902 Segmental and somatic dysfunction of thoracic region: Secondary | ICD-10-CM | POA: Diagnosis not present

## 2018-06-24 MED ORDER — ONDANSETRON 4 MG PO TBDP: 4.0000 mg | ORAL_TABLET | Freq: Three times a day (TID) | ORAL | 2 refills | Status: DC | PRN

## 2018-06-24 MED ORDER — PROMETHAZINE HCL 25 MG PO TABS: 25.0000 mg | ORAL_TABLET | Freq: Four times a day (QID) | ORAL | 2 refills | Status: DC | PRN

## 2018-06-24 NOTE — Telephone Encounter (Signed)
I am fine calling this in for him  zofran 4 mg, 1 po q 8 hours prn nausea, #20, 2 ref  Phenergan 25 mg, 1 po q 6 hours prn nausea, #30, 2 ref

## 2018-06-24 NOTE — Telephone Encounter (Signed)
Russell [name] notified by telephone that Dr. Lorelei Pont has sent the prescriptions he requested to Russell [name] on [address].

## 2018-06-24 NOTE — Telephone Encounter (Signed)
Spouse Danae Chen)  called to make an appointment with you.  [place] zofran and phengren   Can pt be worked in or can rx be called in  [facility] [address]  [employer] number [phone]  ([place])

## 2018-07-16 DIAGNOSIS — M9901 Segmental and somatic dysfunction of cervical region: Secondary | ICD-10-CM | POA: Diagnosis not present

## 2018-07-16 DIAGNOSIS — M5386 Other specified dorsopathies, lumbar region: Secondary | ICD-10-CM | POA: Diagnosis not present

## 2018-07-16 DIAGNOSIS — M9902 Segmental and somatic dysfunction of thoracic region: Secondary | ICD-10-CM | POA: Diagnosis not present

## 2018-07-16 DIAGNOSIS — M9903 Segmental and somatic dysfunction of lumbar region: Secondary | ICD-10-CM | POA: Diagnosis not present

## 2018-08-07 DIAGNOSIS — M9901 Segmental and somatic dysfunction of cervical region: Secondary | ICD-10-CM | POA: Diagnosis not present

## 2018-08-07 DIAGNOSIS — M9902 Segmental and somatic dysfunction of thoracic region: Secondary | ICD-10-CM | POA: Diagnosis not present

## 2018-08-07 DIAGNOSIS — M5386 Other specified dorsopathies, lumbar region: Secondary | ICD-10-CM | POA: Diagnosis not present

## 2018-08-07 DIAGNOSIS — M9903 Segmental and somatic dysfunction of lumbar region: Secondary | ICD-10-CM | POA: Diagnosis not present

## 2018-08-28 DIAGNOSIS — M9902 Segmental and somatic dysfunction of thoracic region: Secondary | ICD-10-CM | POA: Diagnosis not present

## 2018-08-28 DIAGNOSIS — M5386 Other specified dorsopathies, lumbar region: Secondary | ICD-10-CM | POA: Diagnosis not present

## 2018-08-28 DIAGNOSIS — M9901 Segmental and somatic dysfunction of cervical region: Secondary | ICD-10-CM | POA: Diagnosis not present

## 2018-08-28 DIAGNOSIS — M9903 Segmental and somatic dysfunction of lumbar region: Secondary | ICD-10-CM | POA: Diagnosis not present

## 2018-09-09 DIAGNOSIS — M5386 Other specified dorsopathies, lumbar region: Secondary | ICD-10-CM | POA: Diagnosis not present

## 2018-09-09 DIAGNOSIS — M9901 Segmental and somatic dysfunction of cervical region: Secondary | ICD-10-CM | POA: Diagnosis not present

## 2018-09-09 DIAGNOSIS — M9903 Segmental and somatic dysfunction of lumbar region: Secondary | ICD-10-CM | POA: Diagnosis not present

## 2018-09-09 DIAGNOSIS — M9902 Segmental and somatic dysfunction of thoracic region: Secondary | ICD-10-CM | POA: Diagnosis not present

## 2018-09-23 DIAGNOSIS — M5386 Other specified dorsopathies, lumbar region: Secondary | ICD-10-CM | POA: Diagnosis not present

## 2018-09-23 DIAGNOSIS — M9901 Segmental and somatic dysfunction of cervical region: Secondary | ICD-10-CM | POA: Diagnosis not present

## 2018-09-23 DIAGNOSIS — M9903 Segmental and somatic dysfunction of lumbar region: Secondary | ICD-10-CM | POA: Diagnosis not present

## 2018-09-23 DIAGNOSIS — M9902 Segmental and somatic dysfunction of thoracic region: Secondary | ICD-10-CM | POA: Diagnosis not present

## 2018-09-30 DIAGNOSIS — M5386 Other specified dorsopathies, lumbar region: Secondary | ICD-10-CM | POA: Diagnosis not present

## 2018-09-30 DIAGNOSIS — M9903 Segmental and somatic dysfunction of lumbar region: Secondary | ICD-10-CM | POA: Diagnosis not present

## 2018-09-30 DIAGNOSIS — M9901 Segmental and somatic dysfunction of cervical region: Secondary | ICD-10-CM | POA: Diagnosis not present

## 2018-09-30 DIAGNOSIS — M9902 Segmental and somatic dysfunction of thoracic region: Secondary | ICD-10-CM | POA: Diagnosis not present

## 2018-10-24 DIAGNOSIS — M9902 Segmental and somatic dysfunction of thoracic region: Secondary | ICD-10-CM | POA: Diagnosis not present

## 2018-10-24 DIAGNOSIS — M5386 Other specified dorsopathies, lumbar region: Secondary | ICD-10-CM | POA: Diagnosis not present

## 2018-10-24 DIAGNOSIS — M9901 Segmental and somatic dysfunction of cervical region: Secondary | ICD-10-CM | POA: Diagnosis not present

## 2018-10-24 DIAGNOSIS — M9903 Segmental and somatic dysfunction of lumbar region: Secondary | ICD-10-CM | POA: Diagnosis not present

## 2018-10-31 DIAGNOSIS — M9903 Segmental and somatic dysfunction of lumbar region: Secondary | ICD-10-CM | POA: Diagnosis not present

## 2018-10-31 DIAGNOSIS — M9901 Segmental and somatic dysfunction of cervical region: Secondary | ICD-10-CM | POA: Diagnosis not present

## 2018-10-31 DIAGNOSIS — M5386 Other specified dorsopathies, lumbar region: Secondary | ICD-10-CM | POA: Diagnosis not present

## 2018-10-31 DIAGNOSIS — M9902 Segmental and somatic dysfunction of thoracic region: Secondary | ICD-10-CM | POA: Diagnosis not present

## 2018-11-05 DIAGNOSIS — M9902 Segmental and somatic dysfunction of thoracic region: Secondary | ICD-10-CM | POA: Diagnosis not present

## 2018-11-05 DIAGNOSIS — M9901 Segmental and somatic dysfunction of cervical region: Secondary | ICD-10-CM | POA: Diagnosis not present

## 2018-11-05 DIAGNOSIS — M5386 Other specified dorsopathies, lumbar region: Secondary | ICD-10-CM | POA: Diagnosis not present

## 2018-11-05 DIAGNOSIS — M9903 Segmental and somatic dysfunction of lumbar region: Secondary | ICD-10-CM | POA: Diagnosis not present

## 2018-11-21 ENCOUNTER — Ambulatory Visit (INDEPENDENT_AMBULATORY_CARE_PROVIDER_SITE_OTHER): Payer: BC Managed Care – PPO | Admitting: Family Medicine

## 2018-11-21 ENCOUNTER — Encounter: Payer: Self-pay | Admitting: Family Medicine

## 2018-11-21 ENCOUNTER — Other Ambulatory Visit: Payer: Self-pay

## 2018-11-21 ENCOUNTER — Ambulatory Visit: Payer: Self-pay

## 2018-11-21 DIAGNOSIS — M542 Cervicalgia: Secondary | ICD-10-CM | POA: Diagnosis not present

## 2018-11-21 MED ORDER — NABUMETONE 750 MG PO TABS: 750.0000 mg | ORAL_TABLET | Freq: Two times a day (BID) | ORAL | 6 refills | Status: DC | PRN

## 2018-11-21 MED ORDER — TIZANIDINE HCL 2 MG PO TABS: 2.0000 mg | ORAL_TABLET | Freq: Every evening | ORAL | 1 refills | Status: DC | PRN

## 2018-11-21 NOTE — Progress Notes (Signed)
Office Visit Note   Patient: Russell [name]           Date of Birth: [birth year]           MRN: [id number] Visit Date: 11/21/2018 Requested by: Owens Loffler, MD [place],  [place] [address] PCP: Owens Loffler, MD  Subjective: Chief Complaint  Patient presents with  . Neck - Pain    Pain x 4 weeks. Started after seeing a new chiropractor and having an adjustment. Hurts to turn head side-to-side - especially to the right side. No radiating pain down the arms, no numbness/tingling.    HPI: He is a 33 year old right-hand-dominant male with neck pain.  About 1 month ago he went in for a routine chiropractic adjustment.  His usual chiropractor was out, and another 1 did his adjustment.  When he tried to adjust his neck the first time, it did not pop so he tried again and this time it caused significant pain.  He has had ongoing pain since then on the left side of his neck.  It hurts to turn his head side to side.  Denies any radicular symptoms.  He is never had pain in his neck like this before.  He is not taking anything for medication.              ROS: No fevers or chills, no respiratory symptoms.  All other systems were reviewed and are negative.  Objective: Vital Signs: There were no vitals taken for this visit.  Physical Exam:  General:  Alert and oriented, in no acute distress. Pulm:  Breathing unlabored. Psy:  Normal mood, congruent affect. Skin: No rash or bruising. Neck: Slightly decreased rotation to the right, Spurling's test is negative.  Tender trigger point to the left and the C3-4 area seems to reproduce much of his pain.  Upper extremity strength and reflexes are normal.  Imaging: X-ray cervical spine: He has some early disc space narrowing at C5-6, C6-7 along with some facet degenerative change at C7-T1.  Alignment is otherwise anatomic, I do not see any acute abnormality.  Assessment & Plan: 1.  Neck pain 1 month status post chiropractic  adjustment, neurologic exam nonfocal.  Suspect muscular injury but cannot rule out facet syndrome or possibly cervical disc protrusion. -Trial of medications, physical therapy for myofascial release techniques.  If not improving in 1 to 2 weeks and he will contact me and we will order cervical spine MRI scan.     Procedures: No procedures performed  No notes on file     PMFS History: Patient Active Problem List   Diagnosis Date Noted  . Generalized anxiety disorder 12/11/2016  . Family history of valvular heart disease 11/22/2016  . CROHN'S [facility] INTESTINE 05/26/2010  . IRRITABLE BOWEL SYNDROME 09/25/2007   Past Medical History:  Diagnosis Date  . Crohn's disease ([place])   . Irritable bowel syndrome   . Regional enteritis of small intestine ([place]) 7/11   Crohn's ileitis    Family History  Problem Relation Age of Onset  . Healthy Mother   . Healthy Father   . Crohn's disease Cousin   . Hypertension Unknown        family hx  . Colon cancer Maternal Grandfather   . Colon polyps Maternal Grandfather   . Coronary artery disease Neg Hx   . Diabetes Neg Hx     Past Surgical History:  Procedure Laterality Date  . COLONOSCOPY  7/11   Dr.  Ardis Hughs  . TONSILLECTOMY AND ADENOIDECTOMY  1990's   Social History   Occupational History  . Occupation: Pastor  Tobacco Use  . Smoking status: Never Smoker  . Smokeless tobacco: Never Used  Substance and Sexual Activity  . Alcohol use: No    Alcohol/week: 0.0 standard drinks  . Drug use: No  . Sexual activity: Not on file

## 2018-11-25 DIAGNOSIS — M542 Cervicalgia: Secondary | ICD-10-CM | POA: Diagnosis not present

## 2018-11-28 DIAGNOSIS — M542 Cervicalgia: Secondary | ICD-10-CM | POA: Diagnosis not present

## 2018-12-03 DIAGNOSIS — M542 Cervicalgia: Secondary | ICD-10-CM | POA: Diagnosis not present

## 2018-12-06 DIAGNOSIS — M542 Cervicalgia: Secondary | ICD-10-CM | POA: Diagnosis not present

## 2019-04-03 ENCOUNTER — Encounter: Payer: Self-pay | Admitting: Family Medicine

## 2019-04-03 ENCOUNTER — Other Ambulatory Visit: Payer: Self-pay

## 2019-04-03 ENCOUNTER — Ambulatory Visit: Payer: BC Managed Care – PPO | Admitting: Family Medicine

## 2019-04-03 VITALS — BP 118/86 | HR 67 | Temp 98.2°F | Ht 71.5 in | Wt 198.2 lb

## 2019-04-03 DIAGNOSIS — R5383 Other fatigue: Secondary | ICD-10-CM

## 2019-04-03 DIAGNOSIS — Z131 Encounter for screening for diabetes mellitus: Secondary | ICD-10-CM

## 2019-04-03 DIAGNOSIS — L509 Urticaria, unspecified: Secondary | ICD-10-CM | POA: Diagnosis not present

## 2019-04-03 DIAGNOSIS — Z13220 Encounter for screening for lipoid disorders: Secondary | ICD-10-CM

## 2019-04-03 LAB — LIPID PANEL
Cholesterol: 170 mg/dL (ref 0–200)
HDL: 37.6 mg/dL — ABNORMAL LOW (ref >39.00)
LDL Cholesterol: 108 mg/dL — ABNORMAL HIGH (ref 0–99)
NonHDL: 132.07
Total CHOL/HDL Ratio: 5
Triglycerides: 118 mg/dL (ref 0.0–149.0)
VLDL: 23.6 mg/dL (ref 0.0–40.0)

## 2019-04-03 LAB — BASIC METABOLIC PANEL
BUN: 13 mg/dL (ref 6–23)
CO2: 34 mEq/L — ABNORMAL HIGH (ref 19–32)
Calcium: 9.7 mg/dL (ref 8.4–10.5)
Chloride: 102 mEq/L (ref 96–112)
Creatinine, Ser: 0.94 mg/dL (ref 0.40–1.50)
GFR: 91.36 mL/min (ref >60.00)
Glucose, Bld: 90 mg/dL (ref 70–99)
Potassium: 4.7 mEq/L (ref 3.5–5.1)
Sodium: 140 mEq/L (ref 135–145)

## 2019-04-03 LAB — CBC WITH DIFFERENTIAL/PLATELET
Basophils Absolute: 0.1 10*3/uL (ref 0.0–0.1)
Basophils Relative: 0.8 % (ref 0.0–3.0)
Eosinophils Absolute: 0.1 10*3/uL (ref 0.0–0.7)
Eosinophils Relative: 1.6 % (ref 0.0–5.0)
HCT: 43.7 % (ref 39.0–52.0)
Hemoglobin: 14.9 g/dL (ref 13.0–17.0)
Lymphocytes Relative: 26.7 % (ref 12.0–46.0)
Lymphs Abs: 2 10*3/uL (ref 0.7–4.0)
MCHC: 34.1 g/dL (ref 30.0–36.0)
MCV: 85.9 fl (ref 78.0–100.0)
Monocytes Absolute: 0.5 10*3/uL (ref 0.1–1.0)
Monocytes Relative: 6.4 % (ref 3.0–12.0)
Neutro Abs: 4.8 10*3/uL (ref 1.4–7.7)
Neutrophils Relative %: 64.5 % (ref 43.0–77.0)
Platelets: 278 10*3/uL (ref 150.0–400.0)
RBC: 5.09 Mil/uL (ref 4.22–5.81)
RDW: 13.3 % (ref 11.5–15.5)
WBC: 7.4 10*3/uL (ref 4.0–10.5)

## 2019-04-03 LAB — HEPATIC FUNCTION PANEL
ALT: 18 U/L (ref 0–53)
AST: 16 U/L (ref 0–37)
Albumin: 4.9 g/dL (ref 3.5–5.2)
Alkaline Phosphatase: 39 U/L (ref 39–117)
Bilirubin, Direct: 0.1 mg/dL (ref 0.0–0.3)
Total Bilirubin: 0.6 mg/dL (ref 0.2–1.2)
Total Protein: 6.9 g/dL (ref 6.0–8.3)

## 2019-04-03 LAB — HEMOGLOBIN A1C: Hgb A1c MFr Bld: 5.5 % (ref 4.6–6.5)

## 2019-04-03 MED ORDER — PREDNISONE 20 MG PO TABS: ORAL_TABLET | ORAL | 0 refills | Status: DC

## 2019-04-03 NOTE — Progress Notes (Signed)
Russell Whitmoyer T. Arend Bahl, MD Primary Care and [place] at [facility] [place], [address] Phone: [phone]  FAX: [place] - 33 y.o. male  MRN [id number]  Date of Birth: August 24, 1985  Visit Date: 04/03/2019  PCP: Owens Loffler, MD  Referred by: Owens Loffler, MD  Chief Complaint  Patient presents with  . Urticaria   Subjective:   Russell [name] is a 33 y.o. very pleasant male patient who presents with the following:  New onset Urticaria.  Random hives.  No new soaps, etc.  Nothing has changed.  Church and house.  Knot came up the bottom of foot last night.  Like sock has bunched up in her thing.   Intermittent with no known cause, but he does bring pictures into the office on his phone.  Past Medical History, Surgical History, Social History, Family History, Problem List, Medications, and Allergies have been reviewed and updated if relevant.  Patient Active Problem List   Diagnosis Date Noted  . Generalized anxiety disorder 12/11/2016  . Family history of valvular heart disease 11/22/2016  . CROHN'S [facility] INTESTINE 05/26/2010  . IRRITABLE BOWEL SYNDROME 09/25/2007    Past Medical History:  Diagnosis Date  . Crohn's disease ([place])   . Irritable bowel syndrome   . Regional enteritis of small intestine ([place]) 7/11   Crohn's ileitis    Past Surgical History:  Procedure Laterality Date  . COLONOSCOPY  7/11   Dr. Ardis Hughs  . TONSILLECTOMY AND ADENOIDECTOMY  1990's    Social History   Socioeconomic History  . Marital status: Married    Spouse name: Danae Chen  . Number of children: 2  . Years of education: Masters  . Highest education level: Not on file  Occupational History  . Occupation: Theme park manager  Social Needs  . Financial resource strain: Not on file  . Food insecurity    Worry: Not on file    Inability: Not on file  . Transportation needs    Medical: Not on file   Non-medical: Not on file  Tobacco Use  . Smoking status: Never Smoker  . Smokeless tobacco: Never Used  Substance and Sexual Activity  . Alcohol use: No    Alcohol/week: 0.0 standard drinks  . Drug use: No  . Sexual activity: Not on file  Lifestyle  . Physical activity    Days per week: Not on file    Minutes per session: Not on file  . Stress: Not on file  Relationships  . Social Herbalist on phone: Not on file    Gets together: Not on file    Attends religious service: Not on file    Active member of club or organization: Not on file    Attends meetings of clubs or organizations: Not on file    Relationship status: Not on file  . Intimate partner violence    Fear of current or ex partner: Not on file    Emotionally abused: Not on file    Physically abused: Not on file    Forced sexual activity: Not on file  Other Topics Concern  . Not on file  Social History Narrative   Married      1 son      Youth minister; [employer]          Family History  Problem Relation Age of Onset  . Healthy Mother   . Healthy Father   .  Crohn's disease Cousin   . Hypertension Unknown        family hx  . Colon cancer Maternal Grandfather   . Colon polyps Maternal Grandfather   . Coronary artery disease Neg Hx   . Diabetes Neg Hx     Allergies  Allergen Reactions  . Amoxicillin Rash    Medication list reviewed and updated in full in [place].   GEN: No acute illnesses, no fevers, chills. GI: No n/v/d, eating normally Pulm: No SOB Interactive and getting along well at home.  Otherwise, ROS is as per the HPI.  Objective:   BP 118/86   Pulse 67   Temp 98.2 F (36.8 C) (Temporal)   Ht 5' 11.5" (1.816 m)   Wt 198 lb 4 oz (89.9 kg)   SpO2 97%   BMI 27.26 kg/m   GEN: WDWN, NAD, Non-toxic, A & O x 3 HEENT: Atraumatic, Normocephalic. Neck supple. No masses, No LAD. Ears and Nose: No external deformity. Skin: no visible urticaria or rashes  EXTR: No c/c/e NEURO Normal gait.  PSYCH: Normally interactive. Conversant. Not depressed or anxious appearing.  Calm demeanor.   Laboratory and Imaging Data:  Assessment and Plan:     ICD-10-CM   1. Hives  L50.9 CBC with Differential/Platelet    Basic metabolic panel    Hepatic function panel  2. Screening, lipid  Z13.220 Lipid panel  3. Screening for diabetes mellitus  Z13.1 Hemoglobin A1c  4. Fatigue, unspecified type  R53.83 CBC with Differential/Platelet    Basic metabolic panel    Hepatic function panel   Unclear etiology  Patient Instructions  Get some generic fexofenidene (Allegra) or certrizine (Zyrtec) and take just once a day - only 1 type.   Oral H2 blockers:  Like Tagamet or Pepcid AC.  Oral prednisone if this does not work after a few days.    Follow-up: No follow-ups on file.  Meds ordered this encounter  Medications  . predniSONE (DELTASONE) 20 MG tablet    Sig: 2 tabs po daily for 5 days, then 1 tab po daily for 5 days    Dispense:  15 tablet    Refill:  0   Orders Placed This Encounter  Procedures  . CBC with Differential/Platelet  . Basic metabolic panel  . Hepatic function panel  . Lipid panel  . Hemoglobin A1c    Signed,  Jaleah Lefevre T. Bradley Bostelman, MD   Outpatient Encounter Medications as of 04/03/2019  Medication Sig  . predniSONE (DELTASONE) 20 MG tablet 2 tabs po daily for 5 days, then 1 tab po daily for 5 days  . [DISCONTINUED] nabumetone (RELAFEN) 750 MG tablet Take 1 tablet (750 mg total) by mouth 2 (two) times daily as needed.  . [DISCONTINUED] tiZANidine (ZANAFLEX) 2 MG tablet Take 1-2 tablets (2-4 mg total) by mouth at bedtime as needed for muscle spasms.   No facility-administered encounter medications on file as of 04/03/2019.

## 2019-04-03 NOTE — Patient Instructions (Signed)
Get some generic fexofenidene (Allegra) or certrizine (Zyrtec) and take just once a day - only 1 type.   Oral H2 blockers:  Like Tagamet or Pepcid AC.  Oral prednisone if this does not work after a few days.

## 2019-04-04 ENCOUNTER — Other Ambulatory Visit: Payer: BC Managed Care – PPO

## 2019-04-17 ENCOUNTER — Ambulatory Visit (INDEPENDENT_AMBULATORY_CARE_PROVIDER_SITE_OTHER): Payer: BC Managed Care – PPO | Admitting: Family Medicine

## 2019-04-17 ENCOUNTER — Other Ambulatory Visit: Payer: Self-pay

## 2019-04-17 ENCOUNTER — Encounter: Payer: Self-pay | Admitting: Family Medicine

## 2019-04-17 VITALS — BP 118/80 | HR 78 | Temp 98.3°F | Ht 72.0 in | Wt 200.0 lb

## 2019-04-17 DIAGNOSIS — Z0000 Encounter for general adult medical examination without abnormal findings: Secondary | ICD-10-CM

## 2019-04-17 DIAGNOSIS — Z23 Encounter for immunization: Secondary | ICD-10-CM | POA: Diagnosis not present

## 2019-04-17 MED ORDER — ALBUTEROL SULFATE HFA 108 (90 BASE) MCG/ACT IN AERS: 2.0000 | INHALATION_SPRAY | Freq: Four times a day (QID) | RESPIRATORY_TRACT | 3 refills | Status: DC | PRN

## 2019-04-17 NOTE — Progress Notes (Signed)
Russell Griffith T. Neeya [place]ig[place], MD Primary Care and [place] at [facility] [place], [address] Phone: [phone]  FAX: [place] - 33 y.o. male  MRN [id number]  Date of Birth: [birth year]  Visit Date: 04/17/2019  PCP: Owens Loffler, MD  Referred by: Owens Loffler, MD  Chief Complaint  Patient presents with  . Annual Exam   Patient Care Team: Owens Loffler, MD as PCP - General Kennith [place], RD as Dietitian (Family Medicine) Subjective:   BOSTON COOKSON is a 33 y.o. pleasant patient who presents with the following:  Preventative Health Maintenance Visit:  Health Maintenance Summary Reviewed and updated, unless pt declines services.  Tobacco History Reviewed. Alcohol: No concerns, no excessive use Exercise Habits: Some activity, rec at least 30 mins 5 times a week, walking 2 miles daily STD concerns: no risk or activity to increase risk Drug Use: None Encouraged self-testicular check  No risky behaviors [facility] Maintenance  Topic Date Due  . INFLUENZA VACCINE  09/10/2019 (Originally 01/11/2019)  . TETANUS/TDAP  12/20/2026  . HIV Screening  Completed   Immunization History  Administered Date(s) Administered  . Td 10/01/2000  . Tdap 12/19/2016   Patient Active Problem List   Diagnosis Date Noted  . Generalized anxiety disorder 12/11/2016  . Family history of valvular heart disease 11/22/2016  . CROHN'S [facility] INTESTINE 05/26/2010  . IRRITABLE BOWEL SYNDROME 09/25/2007   Past Medical History:  Diagnosis Date  . Crohn's disease ([place])   . Irritable bowel syndrome   . Regional enteritis of small intestine ([place]) 7/11   Crohn's ileitis   Past Surgical History:  Procedure Laterality Date  . COLONOSCOPY  7/11   Dr. Ardis Hughs  . TONSILLECTOMY AND ADENOIDECTOMY  1990's   Social History   Socioeconomic History  . Marital status: Married    Spouse name: Danae Chen   . Number of children: 2  . Years of education: Masters  . Highest education level: Not on file  Occupational History  . Occupation: Theme park manager  Social Needs  . Financial resource strain: Not on file  . Food insecurity    Worry: Not on file    Inability: Not on file  . Transportation needs    Medical: Not on file    Non-medical: Not on file  Tobacco Use  . Smoking status: Never Smoker  . Smokeless tobacco: Never Used  Substance and Sexual Activity  . Alcohol use: No    Alcohol/week: 0.0 standard drinks  . Drug use: No  . Sexual activity: Not on file  Lifestyle  . Physical activity    Days per week: Not on file    Minutes per session: Not on file  . Stress: Not on file  Relationships  . Social Herbalist on phone: Not on file    Gets together: Not on file    Attends religious service: Not on file    Active member of club or organization: Not on file    Attends meetings of clubs or organizations: Not on file    Relationship status: Not on file  . Intimate partner violence    Fear of current or ex partner: Not on file    Emotionally abused: Not on file    Physically abused: Not on file    Forced sexual activity: Not on file  Other Topics Concern  . Not on file  Social History Narrative  Married      1 son      Youth minister; Psychologist, counselling         Family History  Problem Relation Age of Onset  . Healthy Mother   . Healthy Father   . Crohn's disease Cousin   . Hypertension Unknown        family hx  . Colon cancer Maternal Grandfather   . Colon polyps Maternal Grandfather   . Coronary artery disease Neg Hx   . Diabetes Neg Hx    Allergies  Allergen Reactions  . Amoxicillin Rash    Medication list has been reviewed and updated.   General: Denies fever, chills, sweats. No significant weight loss. Eyes: Denies blurring,significant itching ENT: Denies earache, sore throat, and hoarseness. Cardiovascular: Denies chest pains, palpitations,  dyspnea on exertion Respiratory: Denies cough, dyspnea at rest,wheeezing Breast: no concerns about lumps GI: Denies nausea, vomiting, diarrhea, constipation, change in bowel habits, abdominal pain, melena, hematochezia GU: Denies penile discharge, ED, urinary flow / outflow problems. No STD concerns. Musculoskeletal: Denies back pain, joint pain Derm: Denies rash, itching Neuro: Denies  paresthesias, frequent falls, frequent headaches Psych: Denies depression, anxiety Endocrine: Denies cold intolerance, heat intolerance, polydipsia Heme: Denies enlarged lymph nodes Allergy: No hayfever  Objective:   BP 118/80   Pulse 78   Temp 98.3 F (36.8 C) (Temporal)   Ht 6' (1.829 m)   Wt 200 lb (90.7 kg)   SpO2 97%   BMI 27.12 kg/m  Ideal Body Weight: Weight in (lb) to have BMI = 25: 183.9  Ideal Body Weight: Weight in (lb) to have BMI = 25: 183.9 No exam data present Depression screen [facility] 2/9 04/17/2019 12/11/2016  Decreased Interest 0 0  Down, Depressed, Hopeless 0 0  PHQ - 2 Score 0 0  Altered sleeping - 0  Tired, decreased energy - 1  Change in appetite - 0  Feeling bad or failure about yourself  - 0  Trouble concentrating - 0  Moving slowly or fidgety/restless - 1  Suicidal thoughts - 0  PHQ-9 Score - 2     GEN: well developed, well nourished, no acute distress Eyes: conjunctiva and lids normal, PERRLA, EOMI ENT: TM clear, nares clear, oral exam WNL Neck: supple, no lymphadenopathy, no thyromegaly, no JVD Pulm: clear to auscultation and percussion, respiratory effort normal CV: regular rate and rhythm, S1-S2, no murmur, rub or gallop, no bruits, peripheral pulses normal and symmetric, no cyanosis, clubbing, edema or varicosities GI: soft, non-tender; no hepatosplenomegaly, masses; active bowel sounds all quadrants GU: no hernia, testicular mass, penile discharge Lymph: no cervical, axillary or inguinal adenopathy MSK: gait normal, muscle tone and strength WNL, no joint  swelling, effusions, discoloration, crepitus  SKIN: clear, good turgor, color WNL, no rashes, lesions, or ulcerations Neuro: normal mental status, normal strength, sensation, and motion Psych: alert; oriented to person, place and time, normally interactive and not anxious or depressed in appearance. All labs reviewed with patient. Results for orders placed or performed in visit on 04/03/19  CBC with Differential/Platelet  Result Value Ref Range   WBC 7.4 4.0 - 10.5 K/uL   RBC 5.09 4.22 - 5.81 Mil/uL   Hemoglobin 14.9 13.0 - 17.0 g/dL   HCT 43.7 39.0 - 52.0 %   MCV 85.9 78.0 - 100.0 fl   MCHC 34.1 30.0 - 36.0 g/dL   RDW 13.3 11.5 - 15.5 %   Platelets 278.0 150.0 - 400.0 K/uL   Neutrophils Relative % 64.5 43.0 -  77.0 %   Lymphocytes Relative 26.7 12.0 - 46.0 %   Monocytes Relative 6.4 3.0 - 12.0 %   Eosinophils Relative 1.6 0.0 - 5.0 %   Basophils Relative 0.8 0.0 - 3.0 %   Neutro Abs 4.8 1.4 - 7.7 K/uL   Lymphs Abs 2.0 0.7 - 4.0 K/uL   Monocytes Absolute 0.5 0.1 - 1.0 K/uL   Eosinophils Absolute 0.1 0.0 - 0.7 K/uL   Basophils Absolute 0.1 0.0 - 0.1 K/uL  Basic metabolic panel  Result Value Ref Range   Sodium 140 135 - 145 mEq/L   Potassium 4.7 3.5 - 5.1 mEq/L   Chloride 102 96 - 112 mEq/L   CO2 34 (H) 19 - 32 mEq/L   Glucose, Bld 90 70 - 99 mg/dL   BUN 13 6 - 23 mg/dL   Creatinine, Ser 0.94 0.40 - 1.50 mg/dL   Calcium 9.7 8.4 - 10.5 mg/dL   GFR 91.36 >60.00 mL/min  Hepatic function panel  Result Value Ref Range   Total Bilirubin 0.6 0.2 - 1.2 mg/dL   Bilirubin, Direct 0.1 0.0 - 0.3 mg/dL   Alkaline Phosphatase 39 39 - 117 U/L   AST 16 0 - 37 U/L   ALT 18 0 - 53 U/L   Total Protein 6.9 6.0 - 8.3 g/dL   Albumin 4.9 3.5 - 5.2 g/dL  Lipid panel  Result Value Ref Range   Cholesterol 170 0 - 200 mg/dL   Triglycerides 118.0 0.0 - 149.0 mg/dL   HDL 37.60 (L) >39.00 mg/dL   VLDL 23.6 0.0 - 40.0 mg/dL   LDL Cholesterol 108 (H) 0 - 99 mg/dL   Total CHOL/HDL Ratio 5    NonHDL  132.07   Hemoglobin A1c  Result Value Ref Range   Hgb A1c MFr Bld 5.5 4.6 - 6.5 %    Assessment and Plan:     ICD-10-CM   1. Healthcare maintenance  Z00.00    Doing very well.  Be conscious of healthy eating.  Health Maintenance Exam: The patient's preventative maintenance and recommended screening tests for an annual wellness exam were reviewed in full today. Brought up to date unless services declined.  Counselled on the importance of diet, exercise, and its role in overall health and mortality. The patient's FH and SH was reviewed, including their home life, tobacco status, and drug and alcohol status.  Follow-up in 1 year for physical exam or additional follow-up below.  Follow-up: No follow-ups on file. Or follow-up in 1 year if not noted.  No orders of the defined types were placed in this encounter.  There are no discontinued medications. No orders of the defined types were placed in this encounter.   Signed,  Maud Deed. Shadoe Bethel, MD   Allergies as of 04/17/2019      Reactions   Amoxicillin Rash      Medication List       Accurate as of April 17, 2019 10:52 AM. If you have any questions, ask your nurse or doctor.        predniSONE 20 MG tablet Commonly known as: DELTASONE 2 tabs po daily for 5 days, then 1 tab po daily for 5 days

## 2019-04-17 NOTE — Patient Instructions (Signed)
Nexium: cut back to every other day for 2 weeks Then take every 3 days for 2 weeks, then stop.

## 2019-05-05 ENCOUNTER — Ambulatory Visit (INDEPENDENT_AMBULATORY_CARE_PROVIDER_SITE_OTHER): Payer: BC Managed Care – PPO | Admitting: Family Medicine

## 2019-05-05 ENCOUNTER — Encounter: Payer: Self-pay | Admitting: Family Medicine

## 2019-05-05 VITALS — Ht 72.0 in | Wt 195.0 lb

## 2019-05-05 DIAGNOSIS — R05 Cough: Secondary | ICD-10-CM

## 2019-05-05 DIAGNOSIS — R058 Other specified cough: Secondary | ICD-10-CM

## 2019-05-05 DIAGNOSIS — Z20822 Contact with and (suspected) exposure to covid-19: Secondary | ICD-10-CM

## 2019-05-05 DIAGNOSIS — R059 Cough, unspecified: Secondary | ICD-10-CM

## 2019-05-05 DIAGNOSIS — Z20828 Contact with and (suspected) exposure to other viral communicable diseases: Secondary | ICD-10-CM | POA: Diagnosis not present

## 2019-05-05 MED ORDER — DOXYCYCLINE HYCLATE 100 MG PO TABS: 100.0000 mg | ORAL_TABLET | Freq: Two times a day (BID) | ORAL | 0 refills | Status: AC

## 2019-05-05 NOTE — Progress Notes (Signed)
Russell [name] T. Yasseen Salls, MD Primary Care and [place] at [facility] [name], [address] Phone: [phone]  FAX: [place] - 33 y.o. male  MRN [id number]  Date of Birth: April 26, 1986  Visit Date: 05/05/2019  PCP: Russell Loffler, MD  Referred by: Russell Loffler, MD Chief Complaint  Patient presents with  . Cough    yellow/green phelgm & chest tightness.  Wife tested positive for Covid 04/18/19.  He has quarantined for 14 days but not tested.   Virtual Visit via Video Note:  I connected with  Russell [name] on 05/05/2019  3:20 PM EST by a video enabled telemedicine application and verified that I am speaking with the correct person using two identifiers.   Location patient: home computer, tablet, or smartphone Location provider: work or home office Consent: Verbal consent directly obtained from Russell [name]. Persons participating in the virtual visit: patient, provider  I discussed the limitations of evaluation and management by telemedicine and the availability of in person appointments. The patient expressed understanding and agreed to proceed.  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  He presents with some cough and chest tightness.  His wife did test positive for COVID-19 on April 18, 2019.  He ultimately did quarantine for 14 days.  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  He is not having any fever, and his symptoms are essentially pulmonary without any kind of GI symptoms or neurological symptoms.  No changes in his taste or smell.  Review of Systems as above: See pertinent positives and pertinent negatives per HPI No acute distress verbally  Past Medical History, Surgical History, Social History, Family History, Problem List, Medications, and Allergies have been reviewed and updated if relevant.    Observations/Objective/Exam:  An attempt was made to discern vital signs over the phone and per patient if applicable and possible.   General:    Alert, Oriented, appears well and in no acute distress HEENT:     Atraumatic, conjunctiva clear, no obvious abnormalities on inspection of external nose and ears.  Neck:    Normal movements of the head and neck Pulmonary:     On inspection no signs of respiratory distress, breathing rate appears normal, no obvious gross SOB, gasping or wheezing Cardiovascular:    No obvious cyanosis Musculoskeletal:    Moves all visible extremities without noticeable abnormality Psych / Neurological:     Pleasant and cooperative, no obvious depression or anxiety, speech and thought processing grossly intact  Assessment and Plan:    ICD-10-CM   1. Cough  R05   2. Cough with exposure to COVID-19 virus  R05    Z20.828    Level of Medical Decision-Making in this case is Moderate.   Possibly could be pneumonia secondary to COVID-19 after the fact, could attend potentially be COVID-19.  Could be a viral infection that is completely unrelated to this.  I would have the patient obtain COVID-19 x-ray given his high risk from being around multiple people all the time.  He also needs to quarantine.  10 days.  Given the risk of pneumonia I am also going to give him some antibiotics.  I discussed the assessment and treatment plan with the patient. The patient was provided an opportunity to ask questions and all were answered. The patient agreed with the plan and demonstrated an understanding  of the instructions.   The patient was advised to call back or seek an in-person evaluation if the symptoms worsen or if the condition fails to improve as anticipated.  Follow-up: prn unless noted otherwise below No follow-ups on file.  Meds ordered this encounter  Medications  . doxycycline (VIBRA-TABS) 100 MG tablet    Sig: Take 1 tablet (100 mg total) by mouth 2 (two)  times daily for 10 days.    Dispense:  20 tablet    Refill:  0   No orders of the defined types were placed in this encounter.   Signed,  Maud Deed. Tay Whitwell, MD

## 2019-05-14 ENCOUNTER — Encounter: Payer: Self-pay | Admitting: Family Medicine

## 2019-05-14 MED ORDER — LEVOFLOXACIN 500 MG PO TABS: 500.0000 mg | ORAL_TABLET | Freq: Every day | ORAL | 0 refills | Status: DC

## 2019-05-14 MED ORDER — PREDNISONE 20 MG PO TABS: ORAL_TABLET | ORAL | 0 refills | Status: DC

## 2019-05-14 NOTE — Telephone Encounter (Signed)
I think with [place], I want to broaden his antibiotics and put him back on steroids  Levaquin 500 mg, 1 po daily x 7 days, #7  Prednisone 20 mg, 2 tabs po for 5 days, then 1 tab po for 5 days, #15, 0 ref  [place] pharmacy [place]

## 2019-05-14 NOTE — Telephone Encounter (Signed)
Levaquin and Prednisone prescriptions sent to [facility] in [place], [place].  Patient notified by MyChart.

## 2019-05-17 DIAGNOSIS — R0602 Shortness of breath: Secondary | ICD-10-CM | POA: Diagnosis not present

## 2019-05-17 DIAGNOSIS — R05 Cough: Secondary | ICD-10-CM | POA: Diagnosis not present

## 2019-05-20 DIAGNOSIS — R05 Cough: Secondary | ICD-10-CM | POA: Diagnosis not present

## 2019-05-20 DIAGNOSIS — R509 Fever, unspecified: Secondary | ICD-10-CM | POA: Diagnosis not present

## 2019-05-20 DIAGNOSIS — Z20828 Contact with and (suspected) exposure to other viral communicable diseases: Secondary | ICD-10-CM | POA: Diagnosis not present

## 2019-05-28 ENCOUNTER — Other Ambulatory Visit: Payer: Self-pay

## 2019-05-28 ENCOUNTER — Ambulatory Visit (INDEPENDENT_AMBULATORY_CARE_PROVIDER_SITE_OTHER): Payer: BC Managed Care – PPO | Admitting: Otolaryngology

## 2019-05-28 ENCOUNTER — Encounter (INDEPENDENT_AMBULATORY_CARE_PROVIDER_SITE_OTHER): Payer: Self-pay | Admitting: Otolaryngology

## 2019-05-28 VITALS — Temp 97.5°F

## 2019-05-28 DIAGNOSIS — R1314 Dysphagia, pharyngoesophageal phase: Secondary | ICD-10-CM | POA: Diagnosis not present

## 2019-05-28 NOTE — Progress Notes (Signed)
HPI: Russell [name] is a 33 y.o. male who presents for evaluation of throat symptoms.  This initially began a couple weeks ago when he was swallowing a pill he felt like the pill got caught in his throat.  Since that time he has had sensation of tightness in his throat and some difficulty forcing food down when he swallows.  He is able to swallow liquids without any difficulty.  He is able to eat but feels like he has to force the food down.  Denies sore throat. He has had no fevers. He works as a Environmental education officer.  Past Medical History:  Diagnosis Date  . Crohn's disease ([place])   . Irritable bowel syndrome   . Regional enteritis of small intestine ([place]) 7/11   Crohn's ileitis   Past Surgical History:  Procedure Laterality Date  . COLONOSCOPY  7/11   Dr. Ardis Hughs  . TONSILLECTOMY AND ADENOIDECTOMY  1990's   Social History   Socioeconomic History  . Marital status: Married    Spouse name: Danae Chen  . Number of children: 2  . Years of education: Masters  . Highest education level: Not on file  Occupational History  . Occupation: Pastor  Tobacco Use  . Smoking status: Never Smoker  . Smokeless tobacco: Never Used  Substance and Sexual Activity  . Alcohol use: No    Alcohol/week: 0.0 standard drinks  . Drug use: No  . Sexual activity: Not on file  Other Topics Concern  . Not on file  Social History Narrative   Married      1 son      Museum/gallery conservator; Psychologist, counselling         Social Determinants of Health   Financial Resource Strain:   . Difficulty of Paying Living Expenses: Not on file  Food Insecurity:   . Worried About Charity fundraiser in the Last Year: Not on file  . Ran Out of Food in the Last Year: Not on file  Transportation Needs:   . Lack of Transportation (Medical): Not on file  . Lack of Transportation (Non-Medical): Not on file  Physical Activity:   . Days of Exercise per Week: Not on file  . Minutes of Exercise per Session: Not on file  Stress:   .  Feeling of Stress : Not on file  Social Connections:   . Frequency of Communication with Friends and Family: Not on file  . Frequency of Social Gatherings with Friends and Family: Not on file  . Attends Religious Services: Not on file  . Active Member of Clubs or Organizations: Not on file  . Attends Archivist Meetings: Not on file  . Marital Status: Not on file   Family History  Problem Relation Age of Onset  . Healthy Mother   . Healthy Father   . Crohn's disease Cousin   . Hypertension Unknown        family hx  . Colon cancer Maternal Grandfather   . Colon polyps Maternal Grandfather   . Coronary artery disease Neg Hx   . Diabetes Neg Hx    Allergies  Allergen Reactions  . Amoxicillin Rash   Prior to Admission medications   Medication Sig Start Date End Date Taking? Authorizing Provider  albuterol (VENTOLIN HFA) 108 (90 Base) MCG/ACT inhaler Inhale 2 puffs into the lungs every 6 (six) hours as needed for wheezing or shortness of breath. 04/17/19 04/16/20 Yes Copland, Frederico Hamman, MD  levofloxacin (LEVAQUIN) 500 MG tablet Take 1  tablet (500 mg total) by mouth daily. Patient not taking: Reported on 05/28/2019 05/14/19   Copland, Frederico Hamman, MD  predniSONE (DELTASONE) 20 MG tablet Take 2 tabs by mouth x 5 days, then 1 tab by mouth x 5 days Patient not taking: Reported on 05/28/2019 05/14/19   Copland, Frederico Hamman, MD     Positive ROS: Otherwise negative  All other systems have been reviewed and were otherwise negative with the exception of those mentioned in the HPI and as above.  Physical Exam: Constitutional: Alert, well-appearing, no acute distress Ears: External ears without lesions or tenderness. Ear canals are clear bilaterally with intact, clear TMs.  Nasal: External nose without lesions. Septum slightly deviated to the left. Clear nasal passages Fiberoptic laryngoscopy was performed to the right nostril.  Nasopharynx was clear.  Base of tongue was normal.  Epiglottis  was normal.  Vocal cords were normal bilaterally.  Pass the fiberoptic endoscope into the upper esophagus this without any difficulty and the upper cervical esophagus was clear. Oral: Lips and gums without lesions. Tongue and palate mucosa without lesions. Posterior oropharynx clear. Neck: No palpable adenopathy or masses Respiratory: Breathing comfortably  Skin: No facial/neck lesions or rash noted.  Laryngoscopy  Date/Time: 05/28/2019 12:20 PM Performed by: Rozetta Nunnery, MD Authorized by: Rozetta Nunnery, MD   Consent:    Consent obtained:  Verbal   Consent given by:  Patient   Risks discussed:  Pain Procedure details:    Indications: hoarseness, dysphagia, or aspiration     Medication:  Afrin   Instrument: flexible fiberoptic laryngoscope     Scope location: right nare   Mouth:    Posterior pharyngeal wall: normal     Vallecula: normal     Epiglottis: normal   Throat:    Pyriform sinus: normal     False vocal cords: normal     True vocal cords: normal   Comments:     Past 3 laryngoscope through the upper esophageal sphincter without difficulty.  The upper cervical esophagus was clear.    Assessment: Upper pharyngeal esophageal dysphagia.  Clear upper airway examination otherwise. Questionable GE reflux disease  Plan: Prescribed omeprazole 40 mg daily before dinner for 1 month. If symptoms worsen he will call us back and possibly schedule swallow test. Reassured him of normal upper airway examination otherwise.  Radene Journey, MD

## 2019-05-29 ENCOUNTER — Encounter: Payer: Self-pay | Admitting: Gastroenterology

## 2019-07-04 ENCOUNTER — Encounter: Payer: Self-pay | Admitting: Gastroenterology

## 2019-07-04 ENCOUNTER — Ambulatory Visit: Payer: BC Managed Care – PPO | Admitting: Gastroenterology

## 2019-07-04 ENCOUNTER — Other Ambulatory Visit (INDEPENDENT_AMBULATORY_CARE_PROVIDER_SITE_OTHER): Payer: BC Managed Care – PPO

## 2019-07-04 DIAGNOSIS — R1310 Dysphagia, unspecified: Secondary | ICD-10-CM | POA: Diagnosis not present

## 2019-07-04 DIAGNOSIS — K50919 Crohn's disease, unspecified, with unspecified complications: Secondary | ICD-10-CM | POA: Diagnosis not present

## 2019-07-04 LAB — COMPREHENSIVE METABOLIC PANEL
ALT: 23 U/L (ref 0–53)
AST: 18 U/L (ref 0–37)
Albumin: 4.9 g/dL (ref 3.5–5.2)
Alkaline Phosphatase: 34 U/L — ABNORMAL LOW (ref 39–117)
BUN: 15 mg/dL (ref 6–23)
CO2: 29 mEq/L (ref 19–32)
Calcium: 9.7 mg/dL (ref 8.4–10.5)
Chloride: 102 mEq/L (ref 96–112)
Creatinine, Ser: 0.93 mg/dL (ref 0.40–1.50)
GFR: 92.36 mL/min (ref >60.00)
Glucose, Bld: 85 mg/dL (ref 70–99)
Potassium: 4.1 mEq/L (ref 3.5–5.1)
Sodium: 140 mEq/L (ref 135–145)
Total Bilirubin: 0.5 mg/dL (ref 0.2–1.2)
Total Protein: 7.3 g/dL (ref 6.0–8.3)

## 2019-07-04 LAB — CBC WITH DIFFERENTIAL/PLATELET
Basophils Absolute: 0.1 10*3/uL (ref 0.0–0.1)
Basophils Relative: 1.3 % (ref 0.0–3.0)
Eosinophils Absolute: 0.1 10*3/uL (ref 0.0–0.7)
Eosinophils Relative: 1.7 % (ref 0.0–5.0)
HCT: 44.3 % (ref 39.0–52.0)
Hemoglobin: 15.2 g/dL (ref 13.0–17.0)
Lymphocytes Relative: 26.8 % (ref 12.0–46.0)
Lymphs Abs: 1.7 10*3/uL (ref 0.7–4.0)
MCHC: 34.3 g/dL (ref 30.0–36.0)
MCV: 86.7 fl (ref 78.0–100.0)
Monocytes Absolute: 0.4 10*3/uL (ref 0.1–1.0)
Monocytes Relative: 6.6 % (ref 3.0–12.0)
Neutro Abs: 4.1 10*3/uL (ref 1.4–7.7)
Neutrophils Relative %: 63.6 % (ref 43.0–77.0)
Platelets: 262 10*3/uL (ref 150.0–400.0)
RBC: 5.11 Mil/uL (ref 4.22–5.81)
RDW: 13.3 % (ref 11.5–15.5)
WBC: 6.4 10*3/uL (ref 4.0–10.5)

## 2019-07-04 LAB — SEDIMENTATION RATE: Sed Rate: 4 mm/hr (ref 0–15)

## 2019-07-04 LAB — C-REACTIVE PROTEIN: CRP: <1 mg/dL (ref 0.5–20.0)

## 2019-07-04 MED ORDER — SUPREP BOWEL PREP KIT 17.5-3.13-1.6 GM/177ML PO SOLN: 1.0000 | ORAL | 0 refills | Status: DC

## 2019-07-04 NOTE — Patient Instructions (Signed)
If you are age 36 or older, your body mass index should be between 23-30. Your Body mass index is 28.76 kg/m. If this is out of the aforementioned range listed, please consider follow up with your Primary Care Provider.  If you are age 50 or younger, your body mass index should be between 19-25. Your Body mass index is 28.76 kg/m. If this is out of the aformentioned range listed, please consider follow up with your Primary Care Provider.   Your provider has requested that you go to the basement level for lab work before leaving today. Press "B" on the elevator. The lab is located at the first door on the left as you exit the elevator.  You have been scheduled for an endoscopy and colonoscopy. Please follow the written instructions given to you at your visit today. Please pick up your prep supplies at the pharmacy within the next 1-3 days. If you use inhalers (even only as needed), please bring them with you on the day of your procedure.

## 2019-07-04 NOTE — Progress Notes (Signed)
Review of gastrointestinal problems:  1. Crohn's ileocolitis. Presented with diarrhea, urgency, right lower quadrant discomfort. Sedimentation rate normal. Colonoscopy July 2011 found normal colon, terminal ileum was ulcerated, inflamed. Biopsies showed active, chronic ileitis. Small bowel follow-through July 2011 confirmed inflammation in terminal ileum, otherwise was normal. August, 2011 CT scan showed no clear bowel disease however there was some right lower quadrant Subcentimeter lymph nodes . [facility] testing July 2011 was "not consistent with IBD," TPMT phenotype was normal. August, 2011 Entocort caused worse cramping, nausea. September, 2011 prednisone 40 mg a day greatly helped his symptoms however he could not taper past 25-30 mg a day. 04/2010 Evaluated by Antony Blackbird at [facility], was recommended to start immunomodulators but he declined, offered mesalamine and he agreed. Lost to follow up for about 2 years. 06/2012 represented off meds for 2 years, diarrhea, CBC, CMET, sed rate all normal, started pentasa with a very good response quickly. April, 2014: Response to mesalamine product has waned. Colonoscopy 09/2012: moderate terminal ileitis only, normal colon; path confirmed chronic active ileitis. Was started on entocort. 02/2013, entocort helped, when he weaned off his symptoms returned briefly but then improved again without even restarting Entocort. 02/2014: Abdominal pain, change in his bowels, suspicious for flare. Stool test and lab test ordered but he did not do those, I recommended he restart mesalamine at full strength and wean off Entocort which seem to be causing some of his headaches. 05/2015 recommended azathiaprine 2106m daily, restart Entocort. He restart the Entocort but did not try azathioprine.   HPI: This is a very pleasant 33year old man Whom I last saw about 5 years ago here in the office.  He was doing great on 0 Crohn's medicines.  Cutting back on very excessive caffeine intake  certainly helped.  He was going to return to see me 3 months later but I have not really heard from him since then   He is here today with about 2 months of globus, solid food and liquid dysphagia.  This all started when he was battling Covid associated upper respiratory symptoms.  He was proven to be positive for Covid.  He was short of breath.  He underwent 2 rounds of antibiotics and 2 courses of prednisone.  He had to miss work for about 6 weeks.  He eventually recovered from that but he is still bothered by a globus type sensation, somewhat akin to dysphagia.  He was evaluated by ENT and there oropharyngeal examination was negative  No GERD.  He continues to have intermittent episodes of very loose stools.  He has chronic right lower quadrant pain.  His weight has been overall stable he sees no blood in his stool  Old Data Reviewed:     Review of systems: Pertinent positive and negative review of systems were noted in the above HPI section. All other review negative.   Past Medical History:  Diagnosis Date  . Crohn's disease ([place]   . Irritable bowel syndrome   . [place]itis of [place] intestine [place] 7[place]   Crohn's ileitis    Past Surgical History:  Procedure Laterality Date  . COLONOSCOPY  7/11   Dr. JArdis Hughs . TONSILLECTOMY AND ADENOIDECTOMY  1990's    Current Outpatient Medications  Medication Sig Dispense Refill  . albuterol (VENTOLIN HFA) 108 (90 Base) MCG/ACT inhaler Inhale 2 puffs into the lungs every 6 (six) hours as needed for wheezing or shortness of breath. 6.7 g 3   No current facility-administered medications for this visit.  Allergies as of 07/04/2019 - Review Complete 07/04/2019  Allergen Reaction Noted  . Amoxicillin Rash 08/09/2007    Family History  Problem Relation Age of Onset  . Healthy Mother   . Healthy Father   . Crohn's disease Cousin   . Hypertension Other        family hx  . Colon cancer Maternal Grandfather   . Colon  polyps Maternal Grandfather   . Coronary artery disease Neg Hx   . Diabetes Neg Hx     Social History   Socioeconomic History  . Marital status: Married    Spouse name: Danae Chen  . Number of children: 2  . Years of education: Masters  . Highest education level: Not on file  Occupational History  . Occupation: Pastor  Tobacco Use  . Smoking status: Never Smoker  . Smokeless tobacco: Never Used  Substance and Sexual Activity  . Alcohol use: No    Alcohol/week: 0.0 standard drinks  . Drug use: No  . Sexual activity: Not on file  Other Topics Concern  . Not on file  Social History Narrative   Married      1 son      Museum/gallery conservator; Psychologist, counselling         Social Determinants of Health   Financial Resource Strain:   . Difficulty of Paying Living Expenses: Not on file  Food Insecurity:   . Worried About Charity fundraiser in the Last Year: Not on file  . Ran Out of Food in the Last Year: Not on file  Transportation Needs:   . Lack of Transportation (Medical): Not on file  . Lack of Transportation (Non-Medical): Not on file  Physical Activity:   . Days of Exercise per Week: Not on file  . Minutes of Exercise per Session: Not on file  Stress:   . Feeling of Stress : Not on file  Social Connections:   . Frequency of Communication with Friends and Family: Not on file  . Frequency of Social Gatherings with Friends and Family: Not on file  . Attends Religious Services: Not on file  . Active Member of Clubs or Organizations: Not on file  . Attends Archivist Meetings: Not on file  . Marital Status: Not on file  Intimate Partner Violence:   . Fear of Current or Ex-Partner: Not on file  . Emotionally Abused: Not on file  . Physically Abused: Not on file  . Sexually Abused: Not on file     Physical Exam: BP 114/80   Pulse 80   Temp 98.5 F (36.9 C)   Ht 5' 11.5" (1.816 m)   Wt 209 lb 2 oz (94.9 kg)   BMI 28.76 kg/m  Constitutional: generally  well-appearing Psychiatric: alert and oriented x3 Eyes: extraocular movements intact Mouth: oral pharynx moist, no lesions Neck: supple no lymphadenopathy Cardiovascular: heart regular rate and rhythm Lungs: clear to auscultation bilaterally Abdomen: soft, mildly tender right lower quadrant, nondistended, no obvious ascites, no peritoneal signs, normal bowel sounds Extremities: no lower extremity edema bilaterally Skin: no lesions on visible extremities   Assessment and plan: 34 y.o. male with Crohn's ileocolitis, new globus sensation, somewhat dysphagia  Possibly his dysphagia, globus is due to coughing, trauma to the throat nerves, musculature.  Possibly he has esophageal candidiasis without oral thrush.  I think it is very unlikely that he has a cancer.  I recommended EGD at his soonest convenience to evaluate this further.  I also offered  the possibility of simply treating him with Diflucan however he preferred endoscopy.  We discussed his Crohn's disease at length.  He has been battling this for years.  He has really been reluctant to be on a chronic long-term medicine however he is bothered by weeks long episodes of chronic loose stools, he seems to always have right lower quadrant pain.  I recommended evaluation of his colon at the same time as his upper endoscopy and he seems more amenable to considering long-term therapy, I would probably opt for biologic if that were the case.  He will get various blood tests now hopefully preparing for Biologics including CBC, complete metabolic profile, TB testing, hepatitis testing, inflammatory markers.   Please see the "Patient Instructions" section for addition details about the plan.   Owens Loffler, MD [facility] 07/04/2019, 9:32 AM  Cc: Owens Loffler, MD  Total time on date of encounter was 45 minutes (this included time spent preparing to see the patient reviewing records; obtaining and/or reviewing separately  obtained history; performing a medically appropriate exam and/or evaluation; counseling and educating the patient and family if present; ordering medications, tests or procedures if applicable; and documenting clinical information in the health record).

## 2019-07-07 LAB — HEPATITIS C ANTIBODY
Hepatitis C Ab: NONREACTIVE
SIGNAL TO CUT-OFF: 0.02 (ref <1.00)

## 2019-07-07 LAB — QUANTIFERON-TB GOLD PLUS
Mitogen-NIL: >10 IU/mL
NIL: 0.05 IU/mL
QuantiFERON-TB Gold Plus: NEGATIVE
TB1-NIL: 0 IU/mL
TB2-NIL: 0 IU/mL

## 2019-07-07 LAB — HEPATITIS B SURFACE ANTIBODY,QUALITATIVE: Hep B S Ab: REACTIVE — AB

## 2019-07-07 LAB — HEPATITIS B SURFACE ANTIGEN: Hepatitis B Surface Ag: NONREACTIVE

## 2019-07-14 ENCOUNTER — Encounter: Payer: Self-pay | Admitting: Gastroenterology

## 2019-07-16 ENCOUNTER — Other Ambulatory Visit: Payer: Self-pay

## 2019-07-16 ENCOUNTER — Encounter: Payer: Self-pay | Admitting: Gastroenterology

## 2019-07-16 ENCOUNTER — Ambulatory Visit (AMBULATORY_SURGERY_CENTER): Payer: BC Managed Care – PPO | Admitting: Gastroenterology

## 2019-07-16 VITALS — BP 105/69 | HR 78 | Temp 96.2°F | Resp 12 | Ht 71.0 in | Wt 209.0 lb

## 2019-07-16 DIAGNOSIS — K2970 Gastritis, unspecified, without bleeding: Secondary | ICD-10-CM | POA: Diagnosis not present

## 2019-07-16 DIAGNOSIS — K633 Ulcer of intestine: Secondary | ICD-10-CM

## 2019-07-16 DIAGNOSIS — R1310 Dysphagia, unspecified: Secondary | ICD-10-CM | POA: Diagnosis not present

## 2019-07-16 DIAGNOSIS — K50919 Crohn's disease, unspecified, with unspecified complications: Secondary | ICD-10-CM | POA: Diagnosis not present

## 2019-07-16 DIAGNOSIS — K5000 Crohn's disease of small intestine without complications: Secondary | ICD-10-CM | POA: Diagnosis not present

## 2019-07-16 MED ORDER — SODIUM CHLORIDE 0.9 % IV SOLN: 500.0000 mL | Freq: Once | INTRAVENOUS | Status: DC

## 2019-07-16 NOTE — Progress Notes (Signed)
VS- [place]  Pt had covid in December 2020- no need for additional covid test.

## 2019-07-16 NOTE — Progress Notes (Signed)
Pt tolerated well. VSS. Awake and to recovery. 

## 2019-07-16 NOTE — Op Note (Signed)
[place] Patient Name: Russell [name] Procedure Date: 07/16/2019 2:08 PM MRN: [id number] Endoscopist: Milus Banister , MD Age: 34 Referring MD:  Date of Birth: November 25, 1985 Gender: Male Account #: [id number] Procedure:                Upper GI endoscopy Indications:              Dysphagia Medicines:                Monitored Anesthesia Care Procedure:                Pre-Anesthesia Assessment:                           - Prior to the procedure, a History and Physical                            was performed, and patient medications and                            allergies were reviewed. The patient's tolerance of                            previous anesthesia was also reviewed. The risks                            and benefits of the procedure and the sedation                            options and risks were discussed with the patient.                            All questions were answered, and informed consent                            was obtained. Prior Anticoagulants: The patient has                            taken no previous anticoagulant or antiplatelet                            agents. ASA Grade Assessment: II - A patient with                            mild systemic disease. After reviewing the risks                            and benefits, the patient was deemed in                            satisfactory condition to undergo the procedure.                           After obtaining informed consent, the endoscope was  passed under direct vision. Throughout the                            procedure, the patient's blood pressure, pulse, and                            oxygen saturations were monitored continuously. The                            Endoscope was introduced through the mouth, and                            advanced to the second part of duodenum. The upper                            GI endoscopy was accomplished without  difficulty.                            The patient tolerated the procedure well. Scope In: Scope Out: Findings:                 Mild inflammation characterized by erythema was                            found in the gastric antrum. Biopsies were taken                            with a cold forceps for histology.                           The examination was otherwise normal.                           Biopsies were taken from proximal and distal                            esophagus and sent to pathology. Complications:            No immediate complications. Estimated blood loss:                            None. Estimated Blood Loss:     Estimated blood loss: none. Impression:               - Mild distal gastritis, biopsied.                           - Normal examination otherwise. Biopsies taken from                            the esophagus to check for Eosinophilic Esophagitis. Recommendation:           - Patient has a contact number available for                            emergencies. The signs and symptoms of potential  delayed complications were discussed with the                            patient. Return to normal activities tomorrow.                            Written discharge instructions were provided to the                            patient.                           - Resume previous diet.                           - Continue present medications. Trial of OTC                            omeprazole (35m pills one pill 20-30 minutes                            before your first meal of the day).                           - Await pathology results. DMilus Banister MD 07/16/2019 2:48:13 PM This report has been signed electronically.

## 2019-07-16 NOTE — Progress Notes (Signed)
Called to room to assist during endoscopic procedure.  Patient ID and intended procedure confirmed with present staff. Received instructions for my participation in the procedure from the performing physician.  

## 2019-07-16 NOTE — Op Note (Signed)
[facility] Patient Name: Russell [name] Procedure Date: 07/16/2019 2:08 PM MRN: [id number] Endoscopist: Milus Banister , MD Age: 34 Referring MD:  Date of Birth: 1985-10-03 Gender: Male Account #: [id number] Procedure:                Colonoscopy Indications:              Crohn's disease: Presented with diarrhea, urgency,                            right lower quadrant discomfort. Sedimentation rate                            normal. Colonoscopy July 2011 found normal colon,                            terminal ileum was ulcerated, inflamed. Biopsies                            showed active, chronic ileitis. Small bowel                            follow-through July 2011 confirmed inflammation in                            terminal ileum, otherwise was normal. August, 2011                            CT scan showed no clear bowel disease however there                            was some right lower quadrant Subcentimeter lymph                            nodes . [facility] testing July 2011 was "not                            consistent with IBD," TPMT phenotype was normal.                            August, 2011 Entocort caused worse cramping,                            nausea. September, 2011 prednisone 40 mg a day                            greatly helped his symptoms however he could not                            taper past 25-30 mg a day. 04/2010 Evaluated by                            Antony Blackbird at [facility], was recommended to start  immunomodulators but he declined, offered                            mesalamine and he agreed. Lost to follow up for                            about 2 years. 06/2012 represented off meds for 2                            years, diarrhea, CBC, CMET, sed rate all normal,                            started pentasa with a very good response quickly.                            April, 2014: Response to mesalamine product has                             waned. Colonoscopy 09/2012: moderate terminal                            ileitis only, normal colon; path confirmed chronic                            active ileitis. Was started on entocort. 02/2013,                            entocort helped, when he weaned off his symptoms                            returned briefly but then improved again without                            even restarting Entocort. 02/2014: Abdominal pain,                            change in his bowels, suspicious for flare. Stool                            test and lab test ordered but he did not do those,                            I recommended he restart mesalamine at full                            strength and wean off Entocort which seem to be                            causing some of his headaches. 05/2015 recommended                            azathiaprine 240m daily, restart Entocort. He  restart the Entocort but did not try azathioprine. Medicines:                Monitored Anesthesia Care Procedure:                Pre-Anesthesia Assessment:                           - Prior to the procedure, a History and Physical                            was performed, and patient medications and                            allergies were reviewed. The patient's tolerance of                            previous anesthesia was also reviewed. The risks                            and benefits of the procedure and the sedation                            options and risks were discussed with the patient.                            All questions were answered, and informed consent                            was obtained. Prior Anticoagulants: The patient has                            taken no previous anticoagulant or antiplatelet                            agents. ASA Grade Assessment: II - A patient with                            mild systemic disease. After reviewing the  risks                            and benefits, the patient was deemed in                            satisfactory condition to undergo the procedure.                           After obtaining informed consent, the colonoscope                            was passed under direct vision. Throughout the                            procedure, the patient's blood pressure, pulse, and  oxygen saturations were monitored continuously. The                            Colonoscope was introduced through the anus and                            advanced to the the terminal ileum. The colonoscopy                            was performed without difficulty. The patient                            tolerated the procedure well. The quality of the                            bowel preparation was good. The terminal ileum and                            the rectum were photographed. Scope In: 2:15:20 PM Scope Out: 2:26:28 PM Scope Withdrawal Time: 0 hours 9 minutes 51 seconds  Total Procedure Duration: 0 hours 11 minutes 8 seconds  Findings:                 The terminal ileum contained multiple small ulcers                            and very granular, heaped up mucosa. Biopsies were                            taken with a cold forceps for histology. jar 1                           The mucosa in the right colon was normal, biopsies                            taken. jar 2                           The mucosa in the left colon was normal, biopsies                            taken. jar 3                           The mucosa in the rectum was slightly erythematous,                            biopsies taken. jar 4 Complications:            No immediate complications. Estimated blood loss:                            None. Estimated Blood Loss:     Estimated blood loss: none. Impression:               -  Crohn's ileitis. Multiple biopsies taken. Recommendation:           - Patient has a contact  number available for                            emergencies. The signs and symptoms of potential                            delayed complications were discussed with the                            patient. Return to normal activities tomorrow.                            Written discharge instructions were provided to the                            patient.                           - Resume previous diet.                           - Continue present medications.                           - Await pathology results. Will consider another                            course of pentasa, steroids or start biologics                            pending path results. Milus Banister, MD 07/16/2019 2:43:41 PM This report has been signed electronically.

## 2019-07-16 NOTE — Patient Instructions (Signed)
Please read handouts provided. Continue present medications. Await pathology results. Trial of OTC omeprazole ( 20 mg pills, one pill 20-30 minutes before first meal of the day ).      YOU HAD AN ENDOSCOPIC PROCEDURE TODAY AT [facility]:   Refer to the procedure report that was given to you for any specific questions about what was found during the examination.  If the procedure report does not answer your questions, please call your gastroenterologist to clarify.  If you requested that your care partner not be given the details of your procedure findings, then the procedure report has been included in a sealed envelope for you to review at your convenience later.  YOU SHOULD EXPECT: Some feelings of bloating in the abdomen. Passage of more gas than usual.  Walking can help get rid of the air that was put into your GI tract during the procedure and reduce the bloating. If you had a lower endoscopy (such as a colonoscopy or flexible sigmoidoscopy) you may notice spotting of blood in your stool or on the toilet paper. If you underwent a bowel prep for your procedure, you may not have a normal bowel movement for a few days.  Please Note:  You might notice some irritation and congestion in your nose or some drainage.  This is from the oxygen used during your procedure.  There is no need for concern and it should clear up in a day or so.  SYMPTOMS TO REPORT IMMEDIATELY:   Following lower endoscopy (colonoscopy or flexible sigmoidoscopy):  Excessive amounts of blood in the stool  Significant tenderness or worsening of abdominal pains  Swelling of the abdomen that is new, acute  Fever of 100F or higher   Following upper endoscopy (EGD)  Vomiting of blood or coffee ground material  New chest pain or pain under the shoulder blades  Painful or persistently difficult swallowing  New shortness of breath  Fever of 100F or higher  Black, tarry-looking stools  For urgent or  emergent issues, a gastroenterologist can be reached at any hour by calling [phone].   DIET:  We do recommend a small meal at first, but then you may proceed to your regular diet.  Drink plenty of fluids but you should avoid alcoholic beverages for 24 hours.  ACTIVITY:  You should plan to take it easy for the rest of today and you should NOT DRIVE or use heavy machinery until tomorrow (because of the sedation medicines used during the test).    FOLLOW UP: Our staff will call the number listed on your records 48-72 hours following your procedure to check on you and address any questions or concerns that you may have regarding the information given to you following your procedure. If we do not reach you, we will leave a message.  We will attempt to reach you two times.  During this call, we will ask if you have developed any symptoms of COVID 19. If you develop any symptoms (ie: fever, flu-like symptoms, shortness of breath, cough etc.) before then, please call [phone].  If you test positive for Covid 19 in the 2 weeks post procedure, please call and report this information to [place].    If any biopsies were taken you will be contacted by phone or by letter within the next 1-3 weeks.  Please call us at [phone] if you have not heard about the biopsies in 3 weeks.    SIGNATURES/CONFIDENTIALITY: You and/or your care partner  have signed paperwork which will be entered into your electronic medical record.  These signatures attest to the fact that that the information above on your After Visit Summary has been reviewed and is understood.  Full responsibility of the confidentiality of this discharge information lies with you and/or your care-partner.

## 2019-07-18 ENCOUNTER — Telehealth: Payer: Self-pay

## 2019-07-18 NOTE — Telephone Encounter (Signed)
  Follow up Call-  Call back number 07/16/2019  Post procedure Call Back phone  # [phone]  Permission to leave phone message Yes  Some recent data might be hidden     Left message

## 2019-07-18 NOTE — Telephone Encounter (Signed)
Second post procedure follow up call, no answer

## 2019-07-24 ENCOUNTER — Telehealth: Payer: Self-pay

## 2019-07-24 ENCOUNTER — Telehealth: Payer: Self-pay | Admitting: Gastroenterology

## 2019-07-24 DIAGNOSIS — K50919 Crohn's disease, unspecified, with unspecified complications: Secondary | ICD-10-CM

## 2019-07-24 NOTE — Telephone Encounter (Signed)
MR entero scheduled per VO from Dr Ardis Hughs to evaluate extent of crohns.    You have been scheduled for an MRI at [facility]  on 08/07/19. Your appointment time is 9 am. Please arrive 15 minutes prior to your appointment time for registration purposes. Please make certain not to have anything to eat or drink 6 hours prior to your test. In addition, if you have any metal in your body, have a pacemaker or defibrillator, please be sure to let your ordering physician know. This test typically takes 45 minutes to 1 hour to complete. Should you need to reschedule, please call [phone] to do so.   Pt advised via My Chart

## 2019-07-24 NOTE — Telephone Encounter (Signed)
Patient called and said Dr. Ardis Hughs told him to call and have someone page him and he will call back.

## 2019-07-24 NOTE — Telephone Encounter (Signed)
Dr Jac Canavan

## 2019-07-27 NOTE — Telephone Encounter (Signed)
We discussed mr enterography to stage his crohn's. If TI is the only site of obvious disease, then will start entocort.  If there are other SB sites of obvious disease, then will start biologics.

## 2019-08-07 ENCOUNTER — Ambulatory Visit (HOSPITAL_COMMUNITY): Payer: BC Managed Care – PPO

## 2019-10-22 ENCOUNTER — Telehealth: Payer: BC Managed Care – PPO | Admitting: Nurse Practitioner

## 2019-10-22 DIAGNOSIS — J0101 Acute recurrent maxillary sinusitis: Secondary | ICD-10-CM

## 2019-10-22 MED ORDER — DOXYCYCLINE HYCLATE 100 MG PO TABS: 100.0000 mg | ORAL_TABLET | Freq: Two times a day (BID) | ORAL | 0 refills | Status: DC

## 2019-10-22 NOTE — Progress Notes (Signed)
We are sorry that you are not feeling well.  Here is how we plan to help!  Based on what you have shared with me it looks like you have sinusitis.  Sinusitis is inflammation and infection in the sinus cavities of the head.  Based on your presentation I believe you most likely have Acute Bacterial Sinusitis.  This is an infection caused by bacteria and is treated with antibiotics. I have prescribed Doxycycline 146m by mouth twice a day for 10 days. You may use an oral decongestant such as Mucinex D or if you have glaucoma or high blood pressure use plain Mucinex. Saline nasal spray help and can safely be used as often as needed for congestion.  If you develop worsening sinus pain, fever or notice severe headache and vision changes, or if symptoms are not better after completion of antibiotic, please schedule an appointment with a health care provider.    Sinus infections are not as easily transmitted as other respiratory infection, however we still recommend that you avoid close contact with loved ones, especially the very young and elderly.  Remember to wash your hands thoroughly throughout the day as this is the number one way to prevent the spread of infection!  Home Care:  Only take medications as instructed by your medical team.  Complete the entire course of an antibiotic.  Do not take these medications with alcohol.  A steam or ultrasonic humidifier can help congestion.  You can place a towel over your head and breathe in the steam from hot water coming from a faucet.  Avoid close contacts especially the very young and the elderly.  Cover your mouth when you cough or sneeze.  Always remember to wash your hands.  Get Help Right Away If:  You develop worsening fever or sinus pain.  You develop a severe head ache or visual changes.  Your symptoms persist after you have completed your treatment plan.  Make sure you  Understand these instructions.  Will watch your  condition.  Will get help right away if you are not doing well or get worse.  Your e-visit answers were reviewed by a board certified advanced clinical practitioner to complete your personal care plan.  Depending on the condition, your plan could have included both over the counter or prescription medications.  If there is a problem please reply  once you have received a response from your provider.  Your safety is important to [place]  If you have drug allergies check your prescription carefully.    You can use MyChart to ask questions about today's visit, request a non-urgent call back, or ask for a work or school excuse for 24 hours related to this e-Visit. If it has been greater than 24 hours you will need to follow up with your provider, or enter a new e-Visit to address those concerns.  You will get an e-mail in the next two days asking about your experience.  I hope that your e-visit has been valuable and will speed your recovery. Thank you for using e-visits.  5-10 minutes spent reviewing and documenting in chart.

## 2019-11-15 IMAGING — DX DG CHEST 2V
2 series · 2 of 2 positions shown · non-contrast
Comparison: January 10, 2012

CLINICAL DATA: Shortness of Breath

EXAM:
CHEST  2 VIEW

[chest pa]
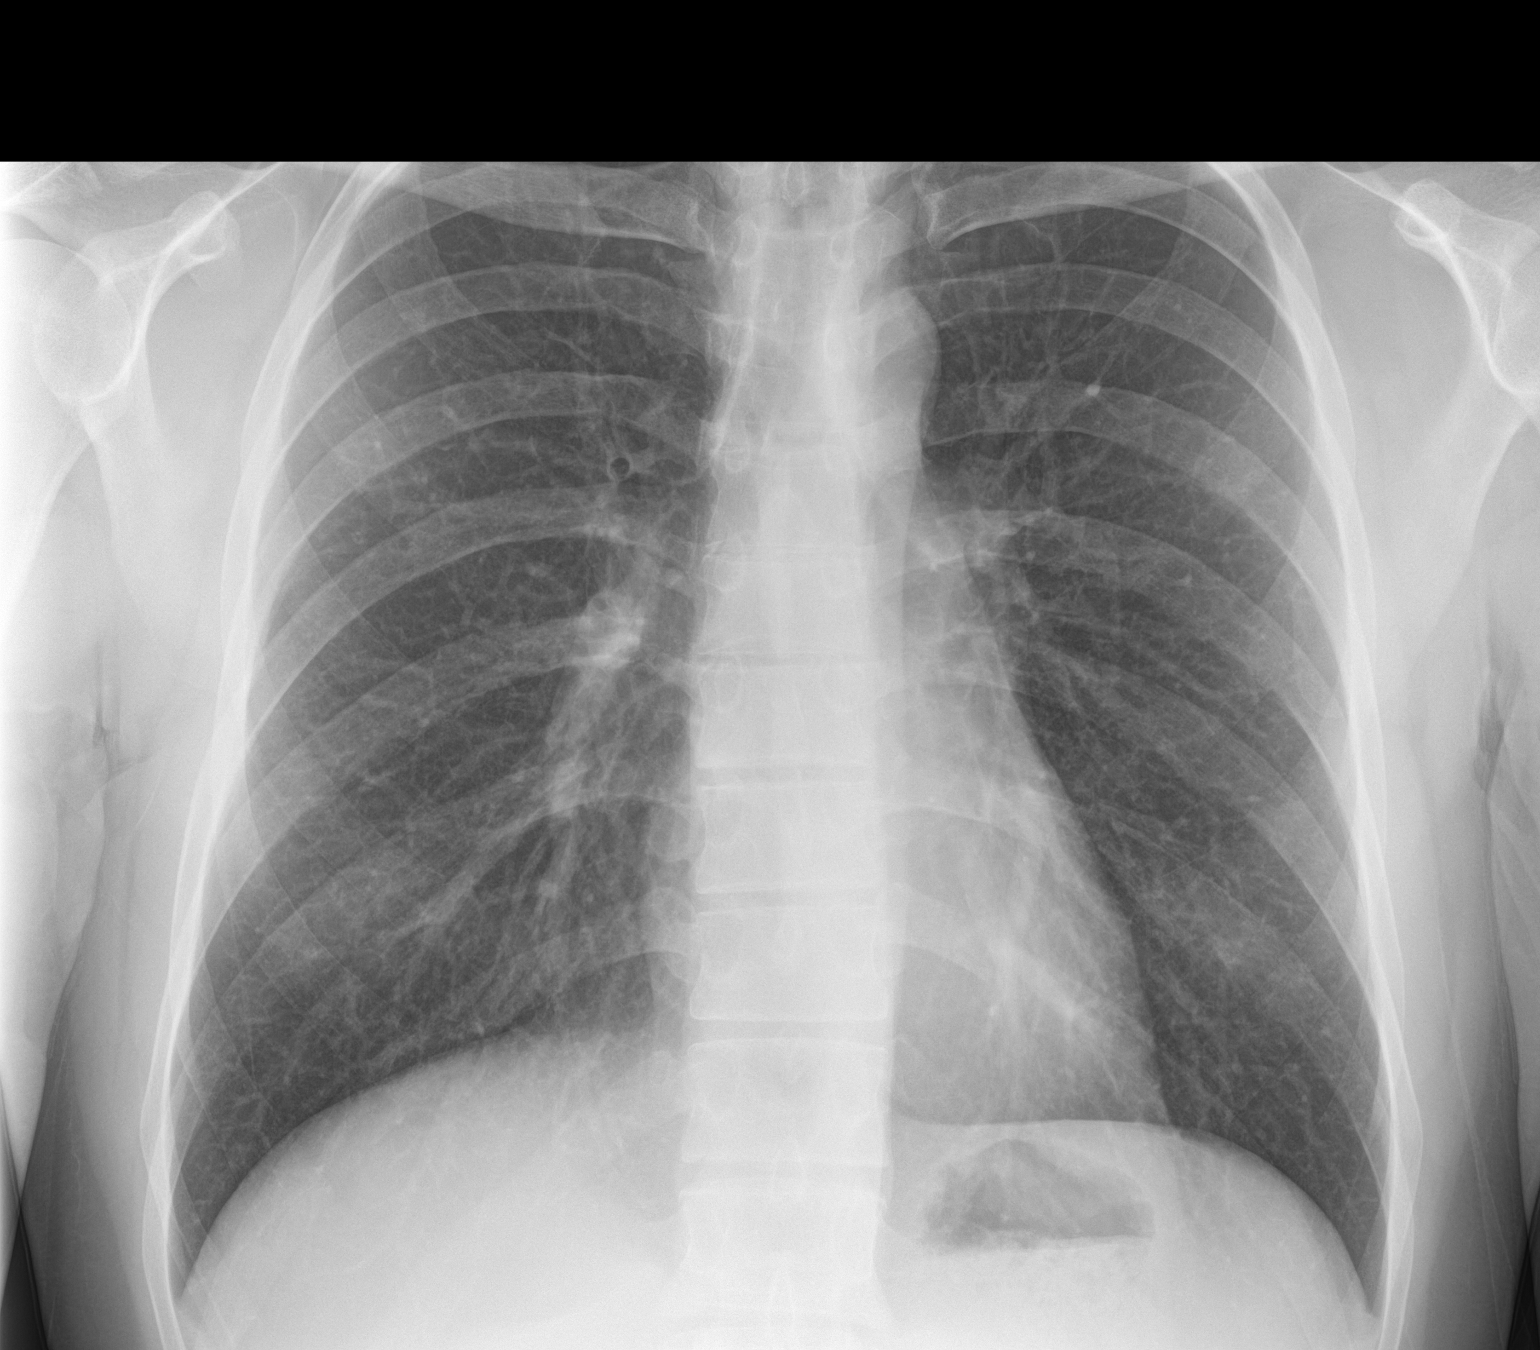

[chest lat]
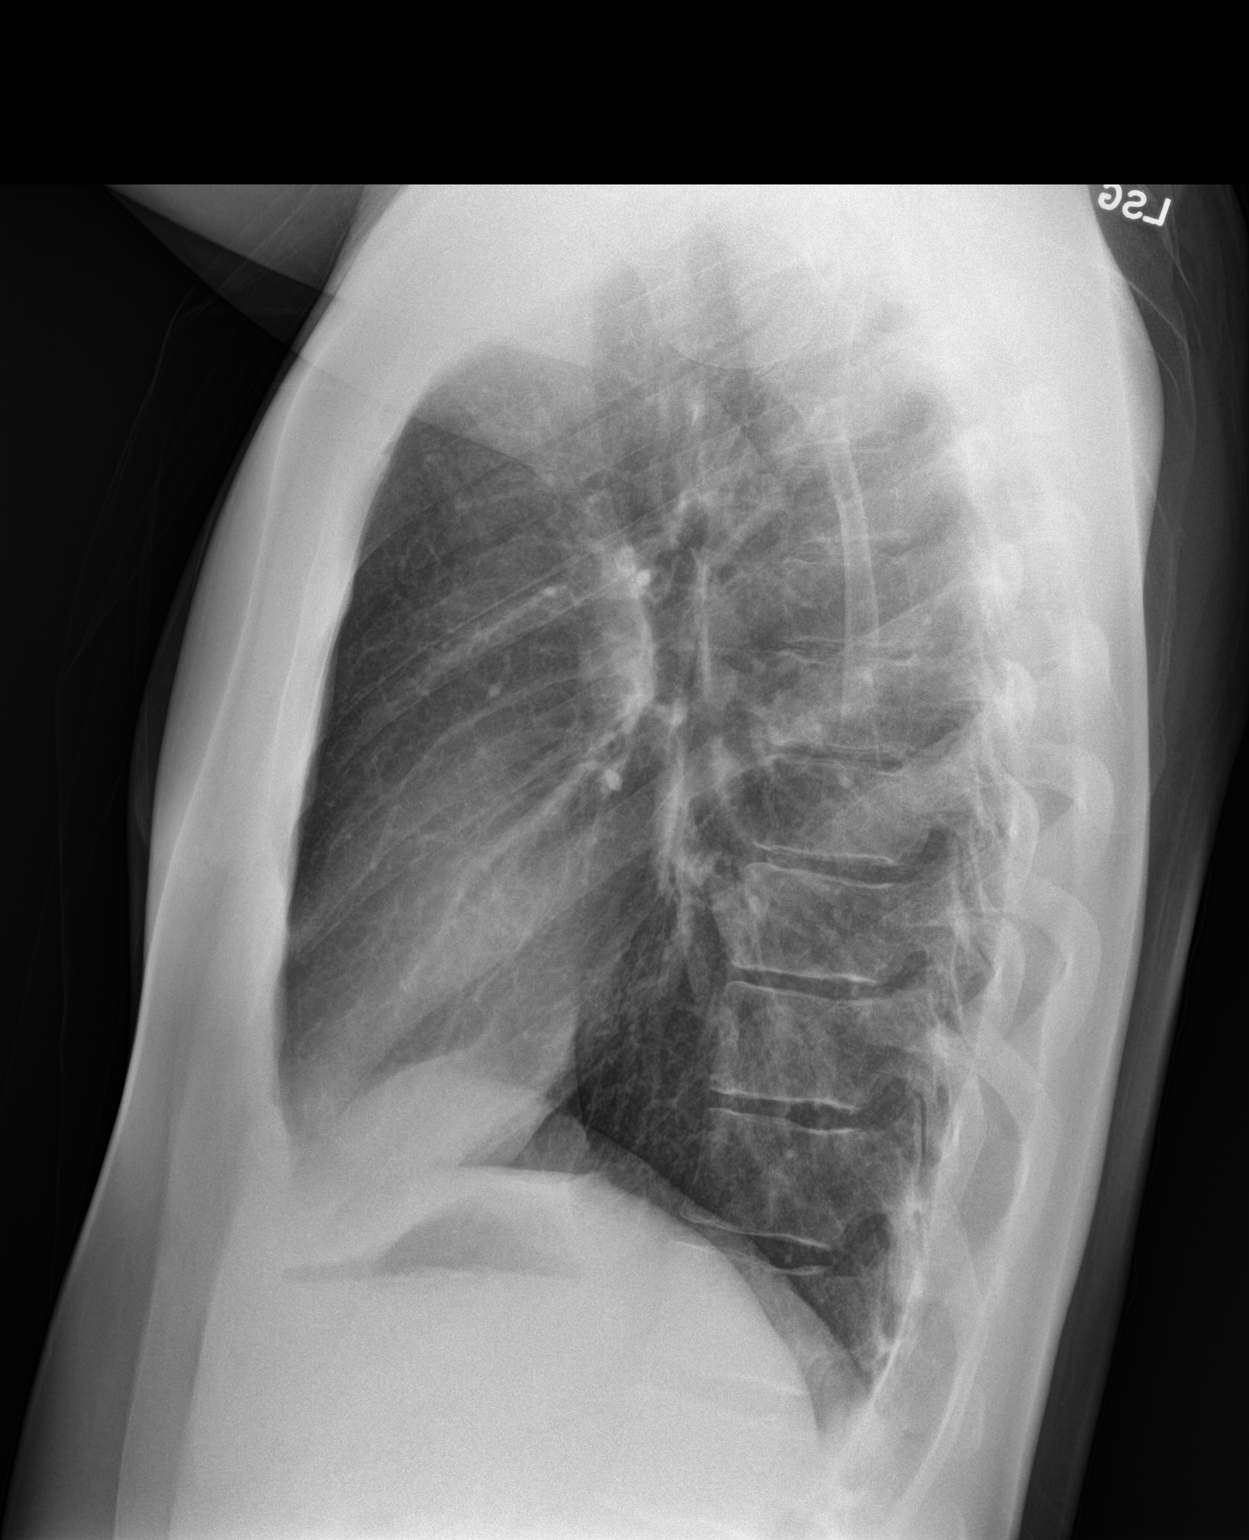

[2 of 2 positions shown; findings below may reference images not displayed]

FINDINGS: There is no edema or consolidation. The heart size and pulmonary
vascularity are normal. No adenopathy. No evident bone lesions.
IMPRESSION: No edema or consolidation.

## 2020-02-25 DIAGNOSIS — M9902 Segmental and somatic dysfunction of thoracic region: Secondary | ICD-10-CM | POA: Diagnosis not present

## 2020-02-25 DIAGNOSIS — M9901 Segmental and somatic dysfunction of cervical region: Secondary | ICD-10-CM | POA: Diagnosis not present

## 2020-02-25 DIAGNOSIS — M5386 Other specified dorsopathies, lumbar region: Secondary | ICD-10-CM | POA: Diagnosis not present

## 2020-02-25 DIAGNOSIS — M9903 Segmental and somatic dysfunction of lumbar region: Secondary | ICD-10-CM | POA: Diagnosis not present

## 2020-03-01 DIAGNOSIS — M9903 Segmental and somatic dysfunction of lumbar region: Secondary | ICD-10-CM | POA: Diagnosis not present

## 2020-03-01 DIAGNOSIS — M9901 Segmental and somatic dysfunction of cervical region: Secondary | ICD-10-CM | POA: Diagnosis not present

## 2020-03-01 DIAGNOSIS — M5386 Other specified dorsopathies, lumbar region: Secondary | ICD-10-CM | POA: Diagnosis not present

## 2020-03-01 DIAGNOSIS — M9902 Segmental and somatic dysfunction of thoracic region: Secondary | ICD-10-CM | POA: Diagnosis not present

## 2020-03-04 ENCOUNTER — Ambulatory Visit: Payer: BC Managed Care – PPO | Admitting: Family Medicine

## 2020-03-04 ENCOUNTER — Encounter: Payer: Self-pay | Admitting: Family Medicine

## 2020-03-04 ENCOUNTER — Other Ambulatory Visit: Payer: Self-pay

## 2020-03-04 VITALS — BP 102/70 | HR 74 | Temp 98.0°F | Ht 71.5 in | Wt 195.0 lb

## 2020-03-04 DIAGNOSIS — E0789 Other specified disorders of thyroid: Secondary | ICD-10-CM

## 2020-03-04 DIAGNOSIS — Z23 Encounter for immunization: Secondary | ICD-10-CM | POA: Diagnosis not present

## 2020-03-04 DIAGNOSIS — E049 Nontoxic goiter, unspecified: Secondary | ICD-10-CM

## 2020-03-04 NOTE — Progress Notes (Signed)
Zaire Vanbuskirk T. Edlin Ford, MD, [place] at [facility] [place], [address]  Phone: [phone]  FAX: [place] - 34 y.o. male  MRN [id number]  Date of Birth: [birth year]  Date: 03/04/2020  PCP: Owens Loffler, MD  Referral: Owens Loffler, MD  Chief Complaint  Patient presents with  . Lump in Throat    8 Months with Issues. Has been to ENT and GI.     This visit occurred during the SARS-CoV-2 public health emergency.  Safety protocols were in place, including screening questions prior to the visit, additional usage of staff PPE, and extensive cleaning of exam room while observing appropriate contact time as indicated for disinfecting solutions.   Subjective:   KENJI MAPEL is a 34 y.o. very pleasant male patient with Body mass index is 26.82 kg/m. who presents with the following:  Felt a pill in his throat. ENT looked in it and scope was fine.  Feb went to GI and endoscopy and everything looked ok.    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  He has been having pain with swallowing and what he felt was a lump in his throat for about a months.  This was after a COVID-19 infection, and prior to this he did not have any of these sensations.  He does have some discomfort in the anterior neck.  At this point he is also seen gastroenterology and had an upper endoscopy as well as colonoscopy.  He does have a history of Crohn's disease, but there were no biopsy-proven Crohn's lesions.  He did have some gastritis.  He also saw ENT which did it sounds like have a laryngoscopy, and this was entirely normal to.  He does not have a known history of thyroid disease.  Review of Systems is noted in the HPI, as appropriate  Objective:   BP 102/70 (BP Location: Left Arm, Patient Position: Sitting, Cuff Size: Large)   Pulse 74   Temp 98 F (36.7 C)   Ht 5' 11.5" (1.816 m)   Wt 195  lb (88.5 kg)   SpO2 97%   BMI 26.82 kg/m   GEN: No acute distress; alert,appropriate. PULM: Breathing comfortably in no respiratory distress PSYCH: Normally interactive.   Full range of motion at the neck.  TMs are clear.  Nontender ear movements.  In the anterior neck just inferior to the larynx the patient does have tenderness to palpation and tenderness and fullness during swallowing.  Laboratory and Imaging Data:  Assessment and Plan:     ICD-10-CM   1. Painful thyroid  E07.89 T3, free    T4, free    TSH    Sedimentation rate    High sensitivity CRP    CBC with Differential/Platelet    US THYROID  2. Need for influenza vaccination  Z23 Flu Vaccine QUAD 6+ mos PF IM (Fluarix Quad PF)  3. Goiter  E04.9 T3, free    T4, free    TSH    Sedimentation rate    High sensitivity CRP    CBC with Differential/Platelet    US THYROID   Anterior neck swelling that has some tenderness to palpation.  Clinical concern for thyroiditis, postinfectious status post COVID-19 thyroiditis, Hashimoto's, or other thyroid condition.  Etiology is not entirely clear.  Obtain evaluations as above.  Procedural notes reviewed. All lab results reviewed   No orders of the defined  types were placed in this encounter.  Medications Discontinued During This Encounter  Medication Reason  . doxycycline (VIBRA-TABS) 100 MG tablet Completed Course   Orders Placed This Encounter  Procedures  . US THYROID  . Flu Vaccine QUAD 6+ mos PF IM (Fluarix Quad PF)  . T3, free  . T4, free  . TSH  . Sedimentation rate  . High sensitivity CRP  . CBC with Differential/Platelet    Signed,  Frederico Hamman T. Myron Lona, MD   Outpatient Encounter Medications as of 03/04/2020  Medication Sig  . albuterol (VENTOLIN HFA) 108 (90 Base) MCG/ACT inhaler Inhale 2 puffs into the lungs every 6 (six) hours as needed for wheezing or shortness of breath. (Patient not taking: Reported on 03/04/2020)  . [DISCONTINUED] doxycycline  (VIBRA-TABS) 100 MG tablet Take 1 tablet (100 mg total) by mouth 2 (two) times daily. 1 po bid   No facility-administered encounter medications on file as of 03/04/2020.

## 2020-03-05 ENCOUNTER — Encounter: Payer: Self-pay | Admitting: Family Medicine

## 2020-03-05 LAB — CBC WITH DIFFERENTIAL/PLATELET
Basophils Absolute: 0.1 10*3/uL (ref 0.0–0.1)
Basophils Relative: 1.1 % (ref 0.0–3.0)
Eosinophils Absolute: 0.2 10*3/uL (ref 0.0–0.7)
Eosinophils Relative: 2.2 % (ref 0.0–5.0)
HCT: 44.4 % (ref 39.0–52.0)
Hemoglobin: 15 g/dL (ref 13.0–17.0)
Lymphocytes Relative: 27.4 % (ref 12.0–46.0)
Lymphs Abs: 2.6 10*3/uL (ref 0.7–4.0)
MCHC: 33.8 g/dL (ref 30.0–36.0)
MCV: 86.5 fl (ref 78.0–100.0)
Monocytes Absolute: 0.7 10*3/uL (ref 0.1–1.0)
Monocytes Relative: 7.4 % (ref 3.0–12.0)
Neutro Abs: 5.8 10*3/uL (ref 1.4–7.7)
Neutrophils Relative %: 61.9 % (ref 43.0–77.0)
Platelets: 285 10*3/uL (ref 150.0–400.0)
RBC: 5.13 Mil/uL (ref 4.22–5.81)
RDW: 13.2 % (ref 11.5–15.5)
WBC: 9.3 10*3/uL (ref 4.0–10.5)

## 2020-03-05 LAB — TSH: TSH: 0.78 u[IU]/mL (ref 0.35–4.50)

## 2020-03-05 LAB — T3, FREE: T3, Free: 3.5 pg/mL (ref 2.3–4.2)

## 2020-03-05 LAB — HIGH SENSITIVITY CRP: CRP, High Sensitivity: 3.86 mg/L (ref 0.000–5.000)

## 2020-03-05 LAB — SEDIMENTATION RATE: Sed Rate: 4 mm/hr (ref 0–15)

## 2020-03-05 LAB — T4, FREE: Free T4: 0.79 ng/dL (ref 0.60–1.60)

## 2020-03-08 DIAGNOSIS — M5386 Other specified dorsopathies, lumbar region: Secondary | ICD-10-CM | POA: Diagnosis not present

## 2020-03-08 DIAGNOSIS — M9901 Segmental and somatic dysfunction of cervical region: Secondary | ICD-10-CM | POA: Diagnosis not present

## 2020-03-08 DIAGNOSIS — M9902 Segmental and somatic dysfunction of thoracic region: Secondary | ICD-10-CM | POA: Diagnosis not present

## 2020-03-08 DIAGNOSIS — M9903 Segmental and somatic dysfunction of lumbar region: Secondary | ICD-10-CM | POA: Diagnosis not present

## 2020-03-10 ENCOUNTER — Ambulatory Visit
Admission: RE | Admit: 2020-03-10 | Discharge: 2020-03-10 | Disposition: A | Discharge status: Home / Self Care | Payer: BC Managed Care – PPO | Source: Ambulatory Visit | Attending: Family Medicine | Admitting: Family Medicine

## 2020-03-10 DIAGNOSIS — J0100 Acute maxillary sinusitis, unspecified: Secondary | ICD-10-CM | POA: Diagnosis not present

## 2020-03-10 DIAGNOSIS — E0789 Other specified disorders of thyroid: Secondary | ICD-10-CM

## 2020-03-10 DIAGNOSIS — E049 Nontoxic goiter, unspecified: Secondary | ICD-10-CM

## 2020-03-10 DIAGNOSIS — E042 Nontoxic multinodular goiter: Secondary | ICD-10-CM | POA: Diagnosis not present

## 2020-03-16 ENCOUNTER — Encounter: Payer: Self-pay | Admitting: Family Medicine

## 2020-03-16 DIAGNOSIS — M9901 Segmental and somatic dysfunction of cervical region: Secondary | ICD-10-CM | POA: Diagnosis not present

## 2020-03-16 DIAGNOSIS — M5386 Other specified dorsopathies, lumbar region: Secondary | ICD-10-CM | POA: Diagnosis not present

## 2020-03-16 DIAGNOSIS — M9902 Segmental and somatic dysfunction of thoracic region: Secondary | ICD-10-CM | POA: Diagnosis not present

## 2020-03-16 DIAGNOSIS — M9903 Segmental and somatic dysfunction of lumbar region: Secondary | ICD-10-CM | POA: Diagnosis not present

## 2020-03-17 MED ORDER — LEVOFLOXACIN 500 MG PO TABS: 500.0000 mg | ORAL_TABLET | Freq: Every day | ORAL | 0 refills | Status: DC

## 2020-03-30 DIAGNOSIS — M9902 Segmental and somatic dysfunction of thoracic region: Secondary | ICD-10-CM | POA: Diagnosis not present

## 2020-03-30 DIAGNOSIS — M9901 Segmental and somatic dysfunction of cervical region: Secondary | ICD-10-CM | POA: Diagnosis not present

## 2020-03-30 DIAGNOSIS — M9903 Segmental and somatic dysfunction of lumbar region: Secondary | ICD-10-CM | POA: Diagnosis not present

## 2020-03-30 DIAGNOSIS — M5386 Other specified dorsopathies, lumbar region: Secondary | ICD-10-CM | POA: Diagnosis not present

## 2020-04-07 DIAGNOSIS — M5386 Other specified dorsopathies, lumbar region: Secondary | ICD-10-CM | POA: Diagnosis not present

## 2020-04-07 DIAGNOSIS — M9901 Segmental and somatic dysfunction of cervical region: Secondary | ICD-10-CM | POA: Diagnosis not present

## 2020-04-07 DIAGNOSIS — M9903 Segmental and somatic dysfunction of lumbar region: Secondary | ICD-10-CM | POA: Diagnosis not present

## 2020-04-07 DIAGNOSIS — M9902 Segmental and somatic dysfunction of thoracic region: Secondary | ICD-10-CM | POA: Diagnosis not present

## 2020-04-19 DIAGNOSIS — M9903 Segmental and somatic dysfunction of lumbar region: Secondary | ICD-10-CM | POA: Diagnosis not present

## 2020-04-19 DIAGNOSIS — M5386 Other specified dorsopathies, lumbar region: Secondary | ICD-10-CM | POA: Diagnosis not present

## 2020-04-19 DIAGNOSIS — M9901 Segmental and somatic dysfunction of cervical region: Secondary | ICD-10-CM | POA: Diagnosis not present

## 2020-04-19 DIAGNOSIS — M9902 Segmental and somatic dysfunction of thoracic region: Secondary | ICD-10-CM | POA: Diagnosis not present

## 2020-05-03 DIAGNOSIS — M9901 Segmental and somatic dysfunction of cervical region: Secondary | ICD-10-CM | POA: Diagnosis not present

## 2020-05-03 DIAGNOSIS — M5386 Other specified dorsopathies, lumbar region: Secondary | ICD-10-CM | POA: Diagnosis not present

## 2020-05-03 DIAGNOSIS — M9903 Segmental and somatic dysfunction of lumbar region: Secondary | ICD-10-CM | POA: Diagnosis not present

## 2020-05-03 DIAGNOSIS — M9902 Segmental and somatic dysfunction of thoracic region: Secondary | ICD-10-CM | POA: Diagnosis not present

## 2020-05-18 DIAGNOSIS — M9901 Segmental and somatic dysfunction of cervical region: Secondary | ICD-10-CM | POA: Diagnosis not present

## 2020-05-18 DIAGNOSIS — M9903 Segmental and somatic dysfunction of lumbar region: Secondary | ICD-10-CM | POA: Diagnosis not present

## 2020-05-18 DIAGNOSIS — M9902 Segmental and somatic dysfunction of thoracic region: Secondary | ICD-10-CM | POA: Diagnosis not present

## 2020-05-18 DIAGNOSIS — M5386 Other specified dorsopathies, lumbar region: Secondary | ICD-10-CM | POA: Diagnosis not present

## 2020-05-24 DIAGNOSIS — M9901 Segmental and somatic dysfunction of cervical region: Secondary | ICD-10-CM | POA: Diagnosis not present

## 2020-05-24 DIAGNOSIS — M5386 Other specified dorsopathies, lumbar region: Secondary | ICD-10-CM | POA: Diagnosis not present

## 2020-05-24 DIAGNOSIS — M9902 Segmental and somatic dysfunction of thoracic region: Secondary | ICD-10-CM | POA: Diagnosis not present

## 2020-05-24 DIAGNOSIS — M9903 Segmental and somatic dysfunction of lumbar region: Secondary | ICD-10-CM | POA: Diagnosis not present

## 2020-05-31 DIAGNOSIS — M5386 Other specified dorsopathies, lumbar region: Secondary | ICD-10-CM | POA: Diagnosis not present

## 2020-05-31 DIAGNOSIS — M9902 Segmental and somatic dysfunction of thoracic region: Secondary | ICD-10-CM | POA: Diagnosis not present

## 2020-05-31 DIAGNOSIS — M9901 Segmental and somatic dysfunction of cervical region: Secondary | ICD-10-CM | POA: Diagnosis not present

## 2020-05-31 DIAGNOSIS — M9903 Segmental and somatic dysfunction of lumbar region: Secondary | ICD-10-CM | POA: Diagnosis not present

## 2020-06-09 DIAGNOSIS — M9903 Segmental and somatic dysfunction of lumbar region: Secondary | ICD-10-CM | POA: Diagnosis not present

## 2020-06-09 DIAGNOSIS — M9902 Segmental and somatic dysfunction of thoracic region: Secondary | ICD-10-CM | POA: Diagnosis not present

## 2020-06-09 DIAGNOSIS — M5386 Other specified dorsopathies, lumbar region: Secondary | ICD-10-CM | POA: Diagnosis not present

## 2020-06-09 DIAGNOSIS — M9901 Segmental and somatic dysfunction of cervical region: Secondary | ICD-10-CM | POA: Diagnosis not present

## 2020-06-21 DIAGNOSIS — M9902 Segmental and somatic dysfunction of thoracic region: Secondary | ICD-10-CM | POA: Diagnosis not present

## 2020-06-21 DIAGNOSIS — M5386 Other specified dorsopathies, lumbar region: Secondary | ICD-10-CM | POA: Diagnosis not present

## 2020-06-21 DIAGNOSIS — M9903 Segmental and somatic dysfunction of lumbar region: Secondary | ICD-10-CM | POA: Diagnosis not present

## 2020-06-21 DIAGNOSIS — M9901 Segmental and somatic dysfunction of cervical region: Secondary | ICD-10-CM | POA: Diagnosis not present

## 2020-06-22 DIAGNOSIS — D2362 Other benign neoplasm of skin of left upper limb, including shoulder: Secondary | ICD-10-CM | POA: Diagnosis not present

## 2020-06-22 DIAGNOSIS — L309 Dermatitis, unspecified: Secondary | ICD-10-CM | POA: Diagnosis not present

## 2020-06-22 DIAGNOSIS — B079 Viral wart, unspecified: Secondary | ICD-10-CM | POA: Diagnosis not present

## 2020-07-26 DIAGNOSIS — M9903 Segmental and somatic dysfunction of lumbar region: Secondary | ICD-10-CM | POA: Diagnosis not present

## 2020-07-26 DIAGNOSIS — M5386 Other specified dorsopathies, lumbar region: Secondary | ICD-10-CM | POA: Diagnosis not present

## 2020-07-26 DIAGNOSIS — M9902 Segmental and somatic dysfunction of thoracic region: Secondary | ICD-10-CM | POA: Diagnosis not present

## 2020-07-26 DIAGNOSIS — M9901 Segmental and somatic dysfunction of cervical region: Secondary | ICD-10-CM | POA: Diagnosis not present

## 2020-08-16 DIAGNOSIS — M9902 Segmental and somatic dysfunction of thoracic region: Secondary | ICD-10-CM | POA: Diagnosis not present

## 2020-08-16 DIAGNOSIS — M9901 Segmental and somatic dysfunction of cervical region: Secondary | ICD-10-CM | POA: Diagnosis not present

## 2020-08-16 DIAGNOSIS — M5386 Other specified dorsopathies, lumbar region: Secondary | ICD-10-CM | POA: Diagnosis not present

## 2020-08-16 DIAGNOSIS — M9903 Segmental and somatic dysfunction of lumbar region: Secondary | ICD-10-CM | POA: Diagnosis not present

## 2020-08-20 MED ORDER — OSELTAMIVIR PHOSPHATE 75 MG PO CAPS: 75.0000 mg | ORAL_CAPSULE | Freq: Two times a day (BID) | ORAL | 0 refills | Status: AC

## 2020-08-23 DIAGNOSIS — M9902 Segmental and somatic dysfunction of thoracic region: Secondary | ICD-10-CM | POA: Diagnosis not present

## 2020-08-23 DIAGNOSIS — M9903 Segmental and somatic dysfunction of lumbar region: Secondary | ICD-10-CM | POA: Diagnosis not present

## 2020-08-23 DIAGNOSIS — M5386 Other specified dorsopathies, lumbar region: Secondary | ICD-10-CM | POA: Diagnosis not present

## 2020-08-23 DIAGNOSIS — M9901 Segmental and somatic dysfunction of cervical region: Secondary | ICD-10-CM | POA: Diagnosis not present

## 2020-08-30 DIAGNOSIS — R062 Wheezing: Secondary | ICD-10-CM

## 2020-08-30 DIAGNOSIS — R042 Hemoptysis: Secondary | ICD-10-CM

## 2020-09-07 ENCOUNTER — Ambulatory Visit
Admission: RE | Admit: 2020-09-07 | Discharge: 2020-09-07 | Disposition: A | Discharge status: Home / Self Care | Payer: BC Managed Care – PPO | Source: Ambulatory Visit | Attending: Family Medicine | Admitting: Family Medicine

## 2020-09-07 DIAGNOSIS — R042 Hemoptysis: Secondary | ICD-10-CM

## 2020-09-07 DIAGNOSIS — R062 Wheezing: Secondary | ICD-10-CM

## 2020-09-07 DIAGNOSIS — R059 Cough, unspecified: Secondary | ICD-10-CM | POA: Diagnosis not present

## 2020-10-15 ENCOUNTER — Other Ambulatory Visit: Payer: Self-pay

## 2020-10-15 ENCOUNTER — Ambulatory Visit: Payer: BC Managed Care – PPO | Admitting: Pulmonary Disease

## 2020-10-15 ENCOUNTER — Encounter: Payer: Self-pay | Admitting: Pulmonary Disease

## 2020-10-15 VITALS — BP 128/88 | HR 69 | Ht 71.5 in | Wt 200.0 lb

## 2020-10-15 DIAGNOSIS — R053 Chronic cough: Secondary | ICD-10-CM | POA: Diagnosis not present

## 2020-10-15 MED ORDER — FLOVENT HFA 110 MCG/ACT IN AERO: 2.0000 | INHALATION_SPRAY | Freq: Two times a day (BID) | RESPIRATORY_TRACT | 12 refills | Status: DC

## 2020-10-15 MED ORDER — FLUTICASONE PROPIONATE 50 MCG/ACT NA SUSP: 1.0000 | Freq: Every day | NASAL | 2 refills | Status: DC

## 2020-10-15 NOTE — Progress Notes (Signed)
[facility]  Chief Complaint  Patient presents with  . Consult    SOB since covid Dec 2020, feels like a lump in throat, constant drainage    Constitutional:  BP 128/88   Pulse 69   Ht 5' 11.5" (1.816 m)   Wt 200 lb (90.7 kg)   SpO2 99%   BMI 27.51 kg/m   Past Medical History:  Crohn's disease, GERD, IBS, COVID 19 infection December 2020  Past Surgical History:  He  has a past surgical history that includes Tonsillectomy and adenoidectomy (1990's) and Colonoscopy (7/11).  Brief Summary:  Russell [name] is a 35 y.o. male with dyspnea.      Subjective:   He had asthma as a child, but outgrew this.  He had COVID 19 infection in December 2020.  He didn't have any specific therapy for this.  Since then he has noticed more labored breathing with activity.  He feels like his air gets tight in mid chest.  He has noticed more problem with allergies with sinus congestion and post nasal drip.  He hears himself wheezing at times.  He has a cough and brings up small amounts of clear sputum.  He was prescribed an albuterol inhaler and this helps.  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  He gets coughing spells sometimes at night.  No prior history of pneumonia.  No history of tuberculosis.  He does smoke cigarettes.  No animal/bird exposures.  No family history of lung disease.  He has been using diet modification to control his Crohn's disease.  He had EGD that showed mild gastritis, and had laryngoscopy with Dr. Radene Journey with ENT that showed upper pharyngeal esophageal dysphagia with clear upper airway exam otherwise.  Chest xray from 09/07/20 was normal.  CBC from 03/04/20 showed WBC 9.3, Hb 15, Eos 0.2 K/uL.  Physical Exam:   Appearance - well kempt   ENMT - no sinus tenderness, no oral exudate, no LAN, Mallampati 3 airway, no stridor, deviated nasal septum, narrow nasal  angles  Respiratory - equal breath sounds bilaterally, no wheezing or rales  CV - s1s2 regular rate and rhythm, no murmurs  Ext - no clubbing, no edema  Skin - no rashes  Psych - normal mood and affect   Pulmonary testing:    Chest Imaging:    Cardiac Tests:   Echo 12/05/16 >> EF 50 to 55%  Social History:  He  reports that he has never smoked. He has never used smokeless tobacco. He reports that he does not drink alcohol and does not use drugs.  Family History:  His family history includes Colon cancer in his maternal grandfather; Colon polyps in his maternal grandfather; Crohn's disease in his cousin; Healthy in his father and mother; Hypertension in an other family member.    Discussion:  He has persistent dyspnea on exertion, cough, and wheeze after having COVID 19 infection.  He has history of childhood asthma.  He reports more trouble with seasonal allergies since COVID infection.  He reports beneficial effect from intermittent albuterol use.  Assessment/Plan:   Chronic cough likely from asthma. - will have him use flovent 110 mcg bid - continue albuterol prn - will arrange for pulmonary function test  Allergic rhinitis with deviated nasal septum. - will have him try flonase and nasal irrigation - he was previously seen by Dr. Radene Journey  with ENT   Crohn's disease. - followed by Dr. Oretha Caprice with [facility]  Time Spent Involved in Patient Care on Day of Examination:  48 minutes  Follow up:  Patient Instructions  Saline nasal spray daily Flonase 1 spray in each nostril daily Flovent two puffs in the morning and two puffs in the evening, and rinse your mouth after each use Albuterol two puffs every 6 hours as needed for cough, wheeze, shortness of breath, or chest congestion Will arrange for pulmonary function test  Follow up in 6 weeks with Dr. Halford Chessman or Nurse Practitioner   Medication List:   Allergies as of 10/15/2020      Reactions    Amoxicillin Rash      Medication List       Accurate as of Oct 15, 2020 10:34 AM. If you have any questions, ask your nurse or doctor.        STOP taking these medications   albuterol 108 (90 Base) MCG/ACT inhaler Commonly known as: VENTOLIN HFA Stopped by: Chesley Mires, MD   levofloxacin 500 MG tablet Commonly known as: LEVAQUIN Stopped by: Chesley Mires, MD     TAKE these medications   ALLERGY MED PO Take by mouth as needed.   Flovent HFA 110 MCG/ACT inhaler Generic drug: fluticasone Inhale 2 puffs into the lungs in the morning and at bedtime. Started by: Chesley Mires, MD   fluticasone 50 MCG/ACT nasal spray Commonly known as: FLONASE Place 1 spray into both nostrils daily. Started by: Chesley Mires, MD       Signature:  Chesley Mires, MD [place] Pager - [phone] 10/15/2020, 10:34 AM

## 2020-10-15 NOTE — Patient Instructions (Signed)
Saline nasal spray daily Flonase 1 spray in each nostril daily Flovent two puffs in the morning and two puffs in the evening, and rinse your mouth after each use Albuterol two puffs every 6 hours as needed for cough, wheeze, shortness of breath, or chest congestion Will arrange for pulmonary function test  Follow up in 6 weeks with Dr. Halford Chessman or Nurse Practitioner

## 2020-11-02 MED ORDER — AZITHROMYCIN 250 MG PO TABS: ORAL_TABLET | ORAL | 0 refills | Status: AC

## 2020-11-02 MED ORDER — PREDNISONE 20 MG PO TABS: ORAL_TABLET | ORAL | 0 refills | Status: DC

## 2020-11-17 ENCOUNTER — Other Ambulatory Visit (HOSPITAL_COMMUNITY): Payer: BC Managed Care – PPO

## 2020-11-19 ENCOUNTER — Ambulatory Visit: Payer: BC Managed Care – PPO | Admitting: Primary Care

## 2020-11-24 ENCOUNTER — Telehealth: Payer: BC Managed Care – PPO | Admitting: Physician Assistant

## 2020-11-24 DIAGNOSIS — J019 Acute sinusitis, unspecified: Secondary | ICD-10-CM

## 2020-11-24 DIAGNOSIS — B9689 Other specified bacterial agents as the cause of diseases classified elsewhere: Secondary | ICD-10-CM | POA: Diagnosis not present

## 2020-11-24 MED ORDER — DOXYCYCLINE HYCLATE 100 MG PO CAPS: 100.0000 mg | ORAL_CAPSULE | Freq: Two times a day (BID) | ORAL | 0 refills | Status: DC

## 2020-11-24 NOTE — Progress Notes (Signed)

## 2020-11-24 NOTE — Progress Notes (Signed)
I have spent 5 minutes in review of e-visit questionnaire, review and updating patient chart, medical decision making and response to patient.   Cecia Egge Cody Birdie Fetty, PA-C    

## 2020-11-29 ENCOUNTER — Other Ambulatory Visit (HOSPITAL_COMMUNITY): Payer: BC Managed Care – PPO

## 2020-12-01 ENCOUNTER — Ambulatory Visit: Payer: BC Managed Care – PPO | Admitting: Primary Care

## 2021-02-17 DIAGNOSIS — M9905 Segmental and somatic dysfunction of pelvic region: Secondary | ICD-10-CM | POA: Diagnosis not present

## 2021-02-17 DIAGNOSIS — M7918 Myalgia, other site: Secondary | ICD-10-CM | POA: Diagnosis not present

## 2021-02-17 DIAGNOSIS — M9904 Segmental and somatic dysfunction of sacral region: Secondary | ICD-10-CM | POA: Diagnosis not present

## 2021-02-17 DIAGNOSIS — M5093 Cervical disc disorder, unspecified, cervicothoracic region: Secondary | ICD-10-CM | POA: Diagnosis not present

## 2021-02-17 DIAGNOSIS — M9902 Segmental and somatic dysfunction of thoracic region: Secondary | ICD-10-CM | POA: Diagnosis not present

## 2021-02-21 ENCOUNTER — Other Ambulatory Visit: Payer: Self-pay | Admitting: Family Medicine

## 2021-02-21 DIAGNOSIS — Z13220 Encounter for screening for lipoid disorders: Secondary | ICD-10-CM

## 2021-02-21 DIAGNOSIS — M5116 Intervertebral disc disorders with radiculopathy, lumbar region: Secondary | ICD-10-CM | POA: Diagnosis not present

## 2021-02-21 DIAGNOSIS — M9901 Segmental and somatic dysfunction of cervical region: Secondary | ICD-10-CM | POA: Diagnosis not present

## 2021-02-21 DIAGNOSIS — M5093 Cervical disc disorder, unspecified, cervicothoracic region: Secondary | ICD-10-CM | POA: Diagnosis not present

## 2021-02-21 DIAGNOSIS — M9902 Segmental and somatic dysfunction of thoracic region: Secondary | ICD-10-CM | POA: Diagnosis not present

## 2021-02-21 DIAGNOSIS — M7918 Myalgia, other site: Secondary | ICD-10-CM | POA: Diagnosis not present

## 2021-02-21 DIAGNOSIS — Z131 Encounter for screening for diabetes mellitus: Secondary | ICD-10-CM

## 2021-02-21 DIAGNOSIS — R5382 Chronic fatigue, unspecified: Secondary | ICD-10-CM

## 2021-02-23 ENCOUNTER — Other Ambulatory Visit (INDEPENDENT_AMBULATORY_CARE_PROVIDER_SITE_OTHER): Payer: BC Managed Care – PPO

## 2021-02-23 ENCOUNTER — Other Ambulatory Visit: Payer: Self-pay

## 2021-02-23 ENCOUNTER — Other Ambulatory Visit: Payer: BC Managed Care – PPO

## 2021-02-23 DIAGNOSIS — R5382 Chronic fatigue, unspecified: Secondary | ICD-10-CM | POA: Diagnosis not present

## 2021-02-23 DIAGNOSIS — Z131 Encounter for screening for diabetes mellitus: Secondary | ICD-10-CM

## 2021-02-23 DIAGNOSIS — Z13220 Encounter for screening for lipoid disorders: Secondary | ICD-10-CM | POA: Diagnosis not present

## 2021-02-23 LAB — HEPATIC FUNCTION PANEL
ALT: 18 U/L (ref 0–53)
AST: 14 U/L (ref 0–37)
Albumin: 4.4 g/dL (ref 3.5–5.2)
Alkaline Phosphatase: 33 U/L — ABNORMAL LOW (ref 39–117)
Bilirubin, Direct: 0 mg/dL (ref 0.0–0.3)
Total Bilirubin: 0.4 mg/dL (ref 0.2–1.2)
Total Protein: 6.7 g/dL (ref 6.0–8.3)

## 2021-02-23 LAB — LIPID PANEL
Cholesterol: 160 mg/dL (ref 0–200)
HDL: 43.7 mg/dL (ref >39.00)
LDL Cholesterol: 90 mg/dL (ref 0–99)
NonHDL: 116.19
Total CHOL/HDL Ratio: 4
Triglycerides: 133 mg/dL (ref 0.0–149.0)
VLDL: 26.6 mg/dL (ref 0.0–40.0)

## 2021-02-23 LAB — BASIC METABOLIC PANEL
BUN: 14 mg/dL (ref 6–23)
CO2: 30 mEq/L (ref 19–32)
Calcium: 9.4 mg/dL (ref 8.4–10.5)
Chloride: 103 mEq/L (ref 96–112)
Creatinine, Ser: 0.88 mg/dL (ref 0.40–1.50)
GFR: 110.37 mL/min (ref >60.00)
Glucose, Bld: 91 mg/dL (ref 70–99)
Potassium: 4.1 mEq/L (ref 3.5–5.1)
Sodium: 141 mEq/L (ref 135–145)

## 2021-02-23 LAB — HEMOGLOBIN A1C: Hgb A1c MFr Bld: 5.6 % (ref 4.6–6.5)

## 2021-02-28 DIAGNOSIS — M9901 Segmental and somatic dysfunction of cervical region: Secondary | ICD-10-CM | POA: Diagnosis not present

## 2021-02-28 DIAGNOSIS — M5093 Cervical disc disorder, unspecified, cervicothoracic region: Secondary | ICD-10-CM | POA: Diagnosis not present

## 2021-02-28 DIAGNOSIS — M9902 Segmental and somatic dysfunction of thoracic region: Secondary | ICD-10-CM | POA: Diagnosis not present

## 2021-02-28 DIAGNOSIS — M5116 Intervertebral disc disorders with radiculopathy, lumbar region: Secondary | ICD-10-CM | POA: Diagnosis not present

## 2021-03-02 ENCOUNTER — Other Ambulatory Visit: Payer: Self-pay

## 2021-03-02 ENCOUNTER — Ambulatory Visit (INDEPENDENT_AMBULATORY_CARE_PROVIDER_SITE_OTHER): Payer: BC Managed Care – PPO | Admitting: Family Medicine

## 2021-03-02 ENCOUNTER — Encounter: Payer: Self-pay | Admitting: Family Medicine

## 2021-03-02 VITALS — BP 100/60 | HR 80 | Temp 98.4°F | Ht 71.75 in | Wt 195.0 lb

## 2021-03-02 DIAGNOSIS — Z0000 Encounter for general adult medical examination without abnormal findings: Secondary | ICD-10-CM | POA: Diagnosis not present

## 2021-03-02 NOTE — Progress Notes (Signed)
Raneem Mendolia T. Aalaiyah Yassin, MD, [facility] [place], [address]  Phone: [phone]  FAX: [place] - 35 y.o. male  MRN [id number]  Date of Birth: Jun 14, 1985  Date: 03/02/2021  PCP: Owens Loffler, MD  Referral: Owens Loffler, MD  Chief Complaint  Patient presents with   Annual Exam    This visit occurred during the SARS-CoV-2 public health emergency.  Safety protocols were in place, including screening questions prior to the visit, additional usage of staff PPE, and extensive cleaning of exam room while observing appropriate contact time as indicated for disinfecting solutions.   Patient Care Team: Owens Loffler, MD as PCP - General Kennith [place], RD as Dietitian (Family Medicine) Subjective:   Russell [name] is a 35 y.o. pleasant patient who presents with the following:  Preventative Health Maintenance Visit:  Health Maintenance Summary Reviewed and updated, unless pt declines services.  Tobacco History Reviewed. Alcohol: None Exercise Habits:  - walks four miles every morning.  STD concerns: no risk or activity to increase risk, married happily. Drug Use: None  Crohn's disease, and he does follow-up regularly with Dr. Carney Bern Covid booster -he has yet to get scheduled and do this.  Puppy last Saturday.  His family is doing great.  He is a lot of gerd.  He does have a deviated septum.  Seeing ent in a few weeks  Preaches a lot.   Once a month, will fast for three days.  He feels very good right now.  Health Maintenance  Topic Date Due   COVID-19 Vaccine (2 - Janssen risk series) 01/29/2020   INFLUENZA VACCINE  01/10/2021   TETANUS/TDAP  12/20/2026   Hepatitis C Screening  Completed   HIV Screening  Completed   HPV VACCINES  Aged Out   Immunization History  Administered Date(s) Administered   Influenza,inj,Quad PF,6+ Mos 04/17/2019, 03/04/2020    Janssen (J&J) SARS-COV-2 Vaccination 01/01/2020   Td 10/01/2000   Tdap 12/19/2016   Patient Active Problem List   Diagnosis Date Noted   Generalized anxiety disorder 12/11/2016   Family history of valvular heart disease 11/22/2016   CROHN'S DISEASE-SMALL INTESTINE 05/26/2010   IRRITABLE BOWEL SYNDROME 09/25/2007    Past Medical History:  Diagnosis Date   Crohn's disease ([place])    GERD (gastroesophageal reflux disease)    Irritable bowel syndrome    Regional enteritis of small intestine ([place]) 7/11   Crohn's ileitis    Past Surgical History:  Procedure Laterality Date   COLONOSCOPY  7/11   Dr. Ardis Hughs   TONSILLECTOMY AND ADENOIDECTOMY  1990's    Family History  Problem Relation Age of Onset   Healthy Mother    Healthy Father    Crohn's disease Cousin    Hypertension Other        family hx   Colon cancer Maternal Grandfather    Colon polyps Maternal Grandfather    Coronary artery disease Neg Hx    Diabetes Neg Hx    Esophageal cancer Neg Hx    Prostate cancer Neg Hx    Rectal cancer Neg Hx     Past Medical History, Surgical History, Social History, Family History, Problem List, Medications, and Allergies have been reviewed and updated if relevant.  Review of Systems: Pertinent positives are listed above.  Otherwise, a full 14 point review of systems has been done in full and it is negative except where it  is noted positive.  Objective:   BP 100/60   Pulse 80   Temp 98.4 F (36.9 C) (Temporal)   Ht 5' 11.75" (1.822 m)   Wt 195 lb (88.5 kg)   SpO2 97%   BMI 26.63 kg/m  Ideal Body Weight: Weight in (lb) to have BMI = 25: 182.7  Ideal Body Weight: Weight in (lb) to have BMI = 25: 182.7 No results found. Depression screen [facility] 2/9 03/02/2021 04/17/2019 12/11/2016  Decreased Interest 0 0 0  Down, Depressed, Hopeless 0 0 0  PHQ - 2 Score 0 0 0  Altered sleeping - - 0  Tired, decreased energy - - 1  Change in appetite - - 0  Feeling bad or failure about yourself  -  - 0  Trouble concentrating - - 0  Moving slowly or fidgety/restless - - 1  Suicidal thoughts - - 0  PHQ-9 Score - - 2     GEN: well developed, well nourished, no acute distress Eyes: conjunctiva and lids normal, PERRLA, EOMI ENT: TM clear, nares clear, oral exam WNL Neck: supple, no lymphadenopathy, no thyromegaly, no JVD Pulm: clear to auscultation and percussion, respiratory effort normal CV: regular rate and rhythm, S1-S2, no murmur, rub or gallop, no bruits, peripheral pulses normal and symmetric, no cyanosis, clubbing, edema or varicosities GI: soft, non-tender; no hepatosplenomegaly, masses; active bowel sounds all quadrants GU: deferred Lymph: no cervical, axillary or inguinal adenopathy MSK: gait normal, muscle tone and strength WNL, no joint swelling, effusions, discoloration, crepitus  SKIN: clear, good turgor, color WNL, no rashes, lesions, or ulcerations Neuro: normal mental status, normal strength, sensation, and motion Psych: alert; oriented to person, place and time, normally interactive and not anxious or depressed in appearance.  All labs reviewed with patient. Results for orders placed or performed in visit on 02/23/21  Lipid panel  Result Value Ref Range   Cholesterol 160 0 - 200 mg/dL   Triglycerides 133.0 0.0 - 149.0 mg/dL   HDL 43.70 >39.00 mg/dL   VLDL 26.6 0.0 - 40.0 mg/dL   LDL Cholesterol 90 0 - 99 mg/dL   Total CHOL/HDL Ratio 4    NonHDL 116.19   Hepatic function panel  Result Value Ref Range   Total Bilirubin 0.4 0.2 - 1.2 mg/dL   Bilirubin, Direct 0.0 0.0 - 0.3 mg/dL   Alkaline Phosphatase 33 (L) 39 - 117 U/L   AST 14 0 - 37 U/L   ALT 18 0 - 53 U/L   Total Protein 6.7 6.0 - 8.3 g/dL   Albumin 4.4 3.5 - 5.2 g/dL  Basic metabolic panel  Result Value Ref Range   Sodium 141 135 - 145 mEq/L   Potassium 4.1 3.5 - 5.1 mEq/L   Chloride 103 96 - 112 mEq/L   CO2 30 19 - 32 mEq/L   Glucose, Bld 91 70 - 99 mg/dL   BUN 14 6 - 23 mg/dL   Creatinine,  Ser 0.88 0.40 - 1.50 mg/dL   GFR 110.37 >60.00 mL/min   Calcium 9.4 8.4 - 10.5 mg/dL  Hemoglobin A1c  Result Value Ref Range   Hgb A1c MFr Bld 5.6 4.6 - 6.5 %    Assessment and Plan:     ICD-10-CM   1. Healthcare maintenance  Z00.00      Globally, he is doing really well. I applauded him on his daily exercise routine and I think that doing a 3-day fast once a month is also a great idea.  He  is going to get his BiValent COVID-19 vaccine.  Health Maintenance Exam: The patient's preventative maintenance and recommended screening tests for an annual wellness exam were reviewed in full today. Brought up to date unless services declined.  Counselled on the importance of diet, exercise, and its role in overall health and mortality. The patient's FH and SH was reviewed, including their home life, tobacco status, and drug and alcohol status.  Follow-up in 1 year for physical exam or additional follow-up below.  Follow-up: No follow-ups on file. Or follow-up in 1 year if not noted.  No orders of the defined types were placed in this encounter.  Medications Discontinued During This Encounter  Medication Reason   diphenhydrAMINE HCl (ALLERGY MED PO) Completed Course   doxycycline (VIBRAMYCIN) 100 MG capsule Completed Course   fluticasone (FLONASE) 50 MCG/ACT nasal spray Completed Course   fluticasone (FLOVENT HFA) 110 MCG/ACT inhaler Completed Course   predniSONE (DELTASONE) 20 MG tablet Completed Course   No orders of the defined types were placed in this encounter.   Signed,  Russell Deed. Manuelita Moxon, MD   Allergies as of 03/02/2021       Reactions   Amoxicillin Rash        Medication List        Accurate as of March 02, 2021  1:39 PM. If you have any questions, ask your nurse or doctor.          STOP taking these medications    ALLERGY MED PO Stopped by: Owens Loffler, MD   doxycycline 100 MG capsule Commonly known as: VIBRAMYCIN Stopped by: Owens Loffler, MD   Flovent HFA 110 MCG/ACT inhaler Generic drug: fluticasone Stopped by: Owens Loffler, MD   fluticasone 50 MCG/ACT nasal spray Commonly known as: FLONASE Stopped by: Owens Loffler, MD   predniSONE 20 MG tablet Commonly known as: DELTASONE Stopped by: Owens Loffler, MD

## 2021-03-16 DIAGNOSIS — M9901 Segmental and somatic dysfunction of cervical region: Secondary | ICD-10-CM | POA: Diagnosis not present

## 2021-03-16 DIAGNOSIS — M7918 Myalgia, other site: Secondary | ICD-10-CM | POA: Diagnosis not present

## 2021-03-16 DIAGNOSIS — M5116 Intervertebral disc disorders with radiculopathy, lumbar region: Secondary | ICD-10-CM | POA: Diagnosis not present

## 2021-03-16 DIAGNOSIS — M9902 Segmental and somatic dysfunction of thoracic region: Secondary | ICD-10-CM | POA: Diagnosis not present

## 2021-03-16 DIAGNOSIS — M5093 Cervical disc disorder, unspecified, cervicothoracic region: Secondary | ICD-10-CM | POA: Diagnosis not present

## 2021-03-16 DIAGNOSIS — J342 Deviated nasal septum: Secondary | ICD-10-CM | POA: Diagnosis not present

## 2021-03-16 DIAGNOSIS — R490 Dysphonia: Secondary | ICD-10-CM | POA: Diagnosis not present

## 2021-03-16 DIAGNOSIS — M25611 Stiffness of right shoulder, not elsewhere classified: Secondary | ICD-10-CM | POA: Diagnosis not present

## 2021-03-16 DIAGNOSIS — M25612 Stiffness of left shoulder, not elsewhere classified: Secondary | ICD-10-CM | POA: Diagnosis not present

## 2021-03-16 DIAGNOSIS — J3489 Other specified disorders of nose and nasal sinuses: Secondary | ICD-10-CM | POA: Diagnosis not present

## 2021-04-01 MED ORDER — MIRTAZAPINE 15 MG PO TABS: 15.0000 mg | ORAL_TABLET | Freq: Every day | ORAL | 3 refills | Status: DC

## 2021-04-05 ENCOUNTER — Telehealth: Payer: BC Managed Care – PPO | Admitting: Physician Assistant

## 2021-04-05 DIAGNOSIS — J019 Acute sinusitis, unspecified: Secondary | ICD-10-CM

## 2021-04-05 DIAGNOSIS — B9689 Other specified bacterial agents as the cause of diseases classified elsewhere: Secondary | ICD-10-CM | POA: Diagnosis not present

## 2021-04-05 MED ORDER — LEVOFLOXACIN 500 MG PO TABS: 500.0000 mg | ORAL_TABLET | Freq: Every day | ORAL | 0 refills | Status: AC

## 2021-04-05 NOTE — Progress Notes (Signed)
E-Visit for Sinus Problems  We are sorry that you are not feeling well.  Here is how we plan to help!  Based on what you have shared with me it looks like you have sinusitis.  Sinusitis is inflammation and infection in the sinus cavities of the head.  Based on your presentation I believe you most likely have Acute Bacterial Sinusitis.  This is an infection caused by bacteria and is treated with antibiotics. I have prescribed Levofloxicin 525m by mouth once daily for 7 days. You may use an oral decongestant such as Mucinex D or if you have glaucoma or high blood pressure use plain Mucinex. Saline nasal spray help and can safely be used as often as needed for congestion.  If you develop worsening sinus pain, fever or notice severe headache and vision changes, or if symptoms are not better after completion of antibiotic, please schedule an appointment with a health care provider.    Sinus infections are not as easily transmitted as other respiratory infection, however we still recommend that you avoid close contact with loved ones, especially the very young and elderly.  Remember to wash your hands thoroughly throughout the day as this is the number one way to prevent the spread of infection!  Home Care: Only take medications as instructed by your medical team. Complete the entire course of an antibiotic. Do not take these medications with alcohol. A steam or ultrasonic humidifier can help congestion.  You can place a towel over your head and breathe in the steam from hot water coming from a faucet. Avoid close contacts especially the very young and the elderly. Cover your mouth when you cough or sneeze. Always remember to wash your hands.  Get Help Right Away If: You develop worsening fever or sinus pain. You develop a severe head ache or visual changes. Your symptoms persist after you have completed your treatment plan.  Make sure you Understand these instructions. Will watch your  condition. Will get help right away if you are not doing well or get worse.  Thank you for choosing an e-visit.  Your e-visit answers were reviewed by a board certified advanced clinical practitioner to complete your personal care plan. Depending upon the condition, your plan could have included both over the counter or prescription medications.  Please review your pharmacy choice. Make sure the pharmacy is open so you can pick up prescription now. If there is a problem, you may contact your provider through [employer] have the prescription routed to another pharmacy.  Your safety is important to [place] If you have drug allergies check your prescription carefully.   For the next 24 hours you can use MyChart to ask questions about today's visit, request a non-urgent call back, or ask for a work or school excuse. You will get an email in the next two days asking about your experience. I hope that your e-visit has been valuable and will speed your recovery.

## 2021-04-05 NOTE — Progress Notes (Signed)
I have spent 5 minutes in review of e-visit questionnaire, review and updating patient chart, medical decision making and response to patient.   Yoshiko Keleher Cody Derenda Giddings, PA-C    

## 2021-05-23 DIAGNOSIS — M9903 Segmental and somatic dysfunction of lumbar region: Secondary | ICD-10-CM | POA: Diagnosis not present

## 2021-05-23 DIAGNOSIS — M9902 Segmental and somatic dysfunction of thoracic region: Secondary | ICD-10-CM | POA: Diagnosis not present

## 2021-05-23 DIAGNOSIS — M9901 Segmental and somatic dysfunction of cervical region: Secondary | ICD-10-CM | POA: Diagnosis not present

## 2021-05-23 DIAGNOSIS — M5386 Other specified dorsopathies, lumbar region: Secondary | ICD-10-CM | POA: Diagnosis not present

## 2021-05-24 DIAGNOSIS — M5386 Other specified dorsopathies, lumbar region: Secondary | ICD-10-CM | POA: Diagnosis not present

## 2021-05-24 DIAGNOSIS — M9902 Segmental and somatic dysfunction of thoracic region: Secondary | ICD-10-CM | POA: Diagnosis not present

## 2021-05-24 DIAGNOSIS — M9901 Segmental and somatic dysfunction of cervical region: Secondary | ICD-10-CM | POA: Diagnosis not present

## 2021-05-24 DIAGNOSIS — M9903 Segmental and somatic dysfunction of lumbar region: Secondary | ICD-10-CM | POA: Diagnosis not present

## 2021-05-30 DIAGNOSIS — M5386 Other specified dorsopathies, lumbar region: Secondary | ICD-10-CM | POA: Diagnosis not present

## 2021-05-30 DIAGNOSIS — M9901 Segmental and somatic dysfunction of cervical region: Secondary | ICD-10-CM | POA: Diagnosis not present

## 2021-05-30 DIAGNOSIS — M9902 Segmental and somatic dysfunction of thoracic region: Secondary | ICD-10-CM | POA: Diagnosis not present

## 2021-05-30 DIAGNOSIS — M9903 Segmental and somatic dysfunction of lumbar region: Secondary | ICD-10-CM | POA: Diagnosis not present

## 2021-06-01 DIAGNOSIS — M9902 Segmental and somatic dysfunction of thoracic region: Secondary | ICD-10-CM | POA: Diagnosis not present

## 2021-06-01 DIAGNOSIS — M9903 Segmental and somatic dysfunction of lumbar region: Secondary | ICD-10-CM | POA: Diagnosis not present

## 2021-06-01 DIAGNOSIS — M9901 Segmental and somatic dysfunction of cervical region: Secondary | ICD-10-CM | POA: Diagnosis not present

## 2021-06-01 DIAGNOSIS — M5386 Other specified dorsopathies, lumbar region: Secondary | ICD-10-CM | POA: Diagnosis not present

## 2021-06-15 ENCOUNTER — Encounter: Payer: Self-pay | Admitting: Family Medicine

## 2021-06-15 MED ORDER — PROMETHAZINE HCL 25 MG PO TABS: 25.0000 mg | ORAL_TABLET | Freq: Four times a day (QID) | ORAL | 2 refills | Status: DC | PRN

## 2021-06-15 MED ORDER — ONDANSETRON 4 MG PO TBDP: 4.0000 mg | ORAL_TABLET | Freq: Three times a day (TID) | ORAL | 2 refills | Status: DC | PRN

## 2021-07-19 ENCOUNTER — Telehealth: Payer: BC Managed Care – PPO | Admitting: Family Medicine

## 2021-07-19 DIAGNOSIS — J029 Acute pharyngitis, unspecified: Secondary | ICD-10-CM

## 2021-07-19 MED ORDER — PROMETHAZINE-DM 6.25-15 MG/5ML PO SYRP: 1.2500 mL | ORAL_SOLUTION | Freq: Three times a day (TID) | ORAL | 0 refills | Status: AC | PRN

## 2021-07-19 MED ORDER — BENZONATATE 100 MG PO CAPS: 100.0000 mg | ORAL_CAPSULE | Freq: Two times a day (BID) | ORAL | 0 refills | Status: DC | PRN

## 2021-07-19 NOTE — Progress Notes (Signed)
E-Visit for Sore Throat  We are sorry that you are not feeling well.  Here is how we plan to help!  Your symptoms indicate a likely viral infection (Pharyngitis).   Pharyngitis is inflammation in the back of the throat which can cause a sore throat, scratchiness and sometimes difficulty swallowing.   Pharyngitis is typically caused by a respiratory virus and will just run its course.  Please keep in mind that your symptoms could last up to 10 days.  For throat pain, we recommend over the counter oral pain relief medications such as acetaminophen or aspirin, or anti-inflammatory medications such as ibuprofen or naproxen sodium.  Topical treatments such as oral throat lozenges or sprays may be used as needed.  Avoid close contact with loved ones, especially the very young and elderly.  Remember to wash your hands thoroughly throughout the day as this is the number one way to prevent the spread of infection and wipe down door knobs and counters with disinfectant.  After careful review of your answers, I would not recommend an antibiotic for your condition.  Antibiotics should not be used to treat conditions that we suspect are caused by viruses like the virus that causes the common cold or flu. However, some people can have Strep with atypical symptoms. You may need formal testing in clinic or office to confirm if your symptoms continue or worsen.  Providers prescribe antibiotics to treat infections caused by bacteria. Antibiotics are very powerful in treating bacterial infections when they are used properly.  To maintain their effectiveness, they should be used only when necessary.  Overuse of antibiotics has resulted in the development of super bugs that are resistant to treatment!    I will order some cough medication for you: tessalon perles and a cough syrup as well.   Home Care: Only take medications as instructed by your medical team. Do not drink alcohol while taking these medications. A steam  or ultrasonic humidifier can help congestion.  You can place a towel over your head and breathe in the steam from hot water coming from a faucet. Avoid close contacts especially the very young and the elderly. Cover your mouth when you cough or sneeze. Always remember to wash your hands.  Get Help Right Away If: You develop worsening fever or throat pain. You develop a severe head ache or visual changes. Your symptoms persist after you have completed your treatment plan.  Make sure you Understand these instructions. Will watch your condition. Will get help right away if you are not doing well or get worse.   Thank you for choosing an e-visit.  Your e-visit answers were reviewed by a board certified advanced clinical practitioner to complete your personal care plan. Depending upon the condition, your plan could have included both over the counter or prescription medications.  Please review your pharmacy choice. Make sure the pharmacy is open so you can pick up prescription now. If there is a problem, you may contact your provider through [employer] and have the prescription routed to another pharmacy.  Your safety is important to [place]. If you have drug allergies check your prescription carefully.   For the next 24 hours you can use MyChart to ask questions about today's visit, request a non-urgent call back, or ask for a work or school excuse. You will get an email in the next two days asking about your experience. I hope that your e-visit has been valuable and will speed your recovery.  I provided  5 minutes of non face-to-face time during this encounter for chart review, medication and order placement, as well as and documentation.

## 2021-07-28 ENCOUNTER — Telehealth: Payer: BC Managed Care – PPO | Admitting: Family Medicine

## 2021-07-28 DIAGNOSIS — J019 Acute sinusitis, unspecified: Secondary | ICD-10-CM | POA: Diagnosis not present

## 2021-07-28 DIAGNOSIS — B9689 Other specified bacterial agents as the cause of diseases classified elsewhere: Secondary | ICD-10-CM | POA: Diagnosis not present

## 2021-07-28 MED ORDER — DOXYCYCLINE HYCLATE 100 MG PO TABS: 100.0000 mg | ORAL_TABLET | Freq: Two times a day (BID) | ORAL | 0 refills | Status: AC

## 2021-07-28 NOTE — Progress Notes (Signed)
E-Visit for Sinus Problems  We are sorry that you are not feeling well.  Here is how we plan to help!  Based on what you have shared with me it looks like you have sinusitis.  Sinusitis is inflammation and infection in the sinus cavities of the head.  Based on your presentation I believe you most likely have Acute Bacterial Sinusitis.  This is an infection caused by bacteria and is treated with antibiotics. I have prescribed Doxycycline 183m by mouth twice a day for 10 days. You may use an oral decongestant such as Mucinex D or if you have glaucoma or high blood pressure use plain Mucinex. Saline nasal spray help and can safely be used as often as needed for congestion.  If you develop worsening sinus pain, fever or notice severe headache and vision changes, or if symptoms are not better after completion of antibiotic, please schedule an appointment with a health care provider.    Sinus infections are not as easily transmitted as other respiratory infection, however we still recommend that you avoid close contact with loved ones, especially the very young and elderly.  Remember to wash your hands thoroughly throughout the day as this is the number one way to prevent the spread of infection!  Home Care: Only take medications as instructed by your medical team. Complete the entire course of an antibiotic. Do not take these medications with alcohol. A steam or ultrasonic humidifier can help congestion.  You can place a towel over your head and breathe in the steam from hot water coming from a faucet. Avoid close contacts especially the very young and the elderly. Cover your mouth when you cough or sneeze. Always remember to wash your hands.  Get Help Right Away If: You develop worsening fever or sinus pain. You develop a severe head ache or visual changes. Your symptoms persist after you have completed your treatment plan.  Make sure you Understand these instructions. Will watch your  condition. Will get help right away if you are not doing well or get worse.  Thank you for choosing an e-visit.  Your e-visit answers were reviewed by a board certified advanced clinical practitioner to complete your personal care plan. Depending upon the condition, your plan could have included both over the counter or prescription medications.  Please review your pharmacy choice. Make sure the pharmacy is open so you can pick up prescription now. If there is a problem, you may contact your provider through [employer] have the prescription routed to another pharmacy.  Your safety is important to [place] If you have drug allergies check your prescription carefully.   For the next 24 hours you can use MyChart to ask questions about today's visit, request a non-urgent call back, or ask for a work or school excuse. You will get an email in the next two days asking about your experience. I hope that your e-visit has been valuable and will speed your recovery.  I provided 5 minutes of non face-to-face time during this encounter for chart review, medication and order placement, as well as and documentation.

## 2021-08-19 DIAGNOSIS — J329 Chronic sinusitis, unspecified: Secondary | ICD-10-CM | POA: Diagnosis not present

## 2021-08-19 DIAGNOSIS — J342 Deviated nasal septum: Secondary | ICD-10-CM | POA: Diagnosis not present

## 2021-08-31 DIAGNOSIS — M9903 Segmental and somatic dysfunction of lumbar region: Secondary | ICD-10-CM | POA: Diagnosis not present

## 2021-08-31 DIAGNOSIS — M9902 Segmental and somatic dysfunction of thoracic region: Secondary | ICD-10-CM | POA: Diagnosis not present

## 2021-08-31 DIAGNOSIS — M9905 Segmental and somatic dysfunction of pelvic region: Secondary | ICD-10-CM | POA: Diagnosis not present

## 2021-08-31 DIAGNOSIS — M9901 Segmental and somatic dysfunction of cervical region: Secondary | ICD-10-CM | POA: Diagnosis not present

## 2021-09-06 DIAGNOSIS — M9902 Segmental and somatic dysfunction of thoracic region: Secondary | ICD-10-CM | POA: Diagnosis not present

## 2021-09-06 DIAGNOSIS — M9903 Segmental and somatic dysfunction of lumbar region: Secondary | ICD-10-CM | POA: Diagnosis not present

## 2021-09-06 DIAGNOSIS — M9901 Segmental and somatic dysfunction of cervical region: Secondary | ICD-10-CM | POA: Diagnosis not present

## 2021-09-06 DIAGNOSIS — M9905 Segmental and somatic dysfunction of pelvic region: Secondary | ICD-10-CM | POA: Diagnosis not present

## 2021-09-26 DIAGNOSIS — J329 Chronic sinusitis, unspecified: Secondary | ICD-10-CM | POA: Diagnosis not present

## 2021-09-26 DIAGNOSIS — J342 Deviated nasal septum: Secondary | ICD-10-CM | POA: Diagnosis not present

## 2021-09-26 DIAGNOSIS — J341 Cyst and mucocele of nose and nasal sinus: Secondary | ICD-10-CM | POA: Diagnosis not present

## 2021-09-26 DIAGNOSIS — J3489 Other specified disorders of nose and nasal sinuses: Secondary | ICD-10-CM | POA: Diagnosis not present

## 2021-09-26 DIAGNOSIS — J343 Hypertrophy of nasal turbinates: Secondary | ICD-10-CM | POA: Diagnosis not present

## 2021-10-10 ENCOUNTER — Telehealth: Payer: BC Managed Care – PPO | Admitting: Physician Assistant

## 2021-10-10 DIAGNOSIS — J019 Acute sinusitis, unspecified: Secondary | ICD-10-CM

## 2021-10-10 DIAGNOSIS — J039 Acute tonsillitis, unspecified: Secondary | ICD-10-CM | POA: Diagnosis not present

## 2021-10-10 DIAGNOSIS — Z20822 Contact with and (suspected) exposure to covid-19: Secondary | ICD-10-CM | POA: Diagnosis not present

## 2021-10-10 DIAGNOSIS — B9689 Other specified bacterial agents as the cause of diseases classified elsewhere: Secondary | ICD-10-CM

## 2021-10-10 DIAGNOSIS — R051 Acute cough: Secondary | ICD-10-CM | POA: Diagnosis not present

## 2021-10-10 DIAGNOSIS — R509 Fever, unspecified: Secondary | ICD-10-CM | POA: Diagnosis not present

## 2021-10-10 MED ORDER — DOXYCYCLINE HYCLATE 100 MG PO TABS: 100.0000 mg | ORAL_TABLET | Freq: Two times a day (BID) | ORAL | 0 refills | Status: DC

## 2021-10-10 NOTE — Progress Notes (Signed)
E-Visit for Sinus Problems ? ?We are sorry that you are not feeling well.  Here is how we plan to help! ? ?Based on what you have shared with me it looks like you have sinusitis.  Sinusitis is inflammation and infection in the sinus cavities of the head.  Based on your presentation I believe you most likely have Acute Bacterial Sinusitis.  This is an infection caused by bacteria and is treated with antibiotics. I have prescribed Doxycycline 169m by mouth twice a day for 10 days. You may use an oral decongestant such as Mucinex D or if you have glaucoma or high blood pressure use plain Mucinex. Saline nasal spray help and can safely be used as often as needed for congestion.  If you develop worsening sinus pain, fever or notice severe headache and vision changes, or if symptoms are not better after completion of antibiotic, please schedule an appointment with a health care provider.   ? ?Sinus infections are not as easily transmitted as other respiratory infection, however we still recommend that you avoid close contact with loved ones, especially the very young and elderly.  Remember to wash your hands thoroughly throughout the day as this is the number one way to prevent the spread of infection! ? ?Home Care: ?Only take medications as instructed by your medical team. ?Complete the entire course of an antibiotic. ?Do not take these medications with alcohol. ?A steam or ultrasonic humidifier can help congestion.  You can place a towel over your head and breathe in the steam from hot water coming from a faucet. ?Avoid close contacts especially the very young and the elderly. ?Cover your mouth when you cough or sneeze. ?Always remember to wash your hands. ? ?Get Help Right Away If: ?You develop worsening fever or sinus pain. ?You develop a severe head ache or visual changes. ?Your symptoms persist after you have completed your treatment plan. ? ?Make sure you ?Understand these instructions. ?Will watch your  condition. ?Will get help right away if you are not doing well or get worse. ? ?Thank you for choosing an e-visit. ? ?Your e-visit answers were reviewed by a board certified advanced clinical practitioner to complete your personal care plan. Depending upon the condition, your plan could have included both over the counter or prescription medications. ? ?Please review your pharmacy choice. Make sure the pharmacy is open so you can pick up prescription now. If there is a problem, you may contact your provider through [employer] have the prescription routed to another pharmacy.  Your safety is important to [place] If you have drug allergies check your prescription carefully.  ? ?For the next 24 hours you can use MyChart to ask questions about today's visit, request a non-urgent call back, or ask for a work or school excuse. ?You will get an email in the next two days asking about your experience. I hope that your e-visit has been valuable and will speed your recovery. ? ?I provided 5 minutes of non face-to-face time during this encounter for chart review and documentation.  ? ?

## 2021-11-15 DIAGNOSIS — J343 Hypertrophy of nasal turbinates: Secondary | ICD-10-CM | POA: Diagnosis not present

## 2021-11-15 DIAGNOSIS — J342 Deviated nasal septum: Secondary | ICD-10-CM | POA: Diagnosis not present

## 2021-11-15 DIAGNOSIS — J329 Chronic sinusitis, unspecified: Secondary | ICD-10-CM | POA: Diagnosis not present

## 2021-11-16 ENCOUNTER — Ambulatory Visit: Payer: BC Managed Care – PPO | Admitting: Family Medicine

## 2021-11-28 ENCOUNTER — Ambulatory Visit: Payer: BC Managed Care – PPO | Admitting: Family Medicine

## 2022-01-23 DIAGNOSIS — M9902 Segmental and somatic dysfunction of thoracic region: Secondary | ICD-10-CM | POA: Diagnosis not present

## 2022-01-23 DIAGNOSIS — M9901 Segmental and somatic dysfunction of cervical region: Secondary | ICD-10-CM | POA: Diagnosis not present

## 2022-01-23 DIAGNOSIS — M9903 Segmental and somatic dysfunction of lumbar region: Secondary | ICD-10-CM | POA: Diagnosis not present

## 2022-01-23 DIAGNOSIS — M9905 Segmental and somatic dysfunction of pelvic region: Secondary | ICD-10-CM | POA: Diagnosis not present

## 2022-01-24 DIAGNOSIS — M9901 Segmental and somatic dysfunction of cervical region: Secondary | ICD-10-CM | POA: Diagnosis not present

## 2022-01-24 DIAGNOSIS — M9903 Segmental and somatic dysfunction of lumbar region: Secondary | ICD-10-CM | POA: Diagnosis not present

## 2022-01-24 DIAGNOSIS — M9905 Segmental and somatic dysfunction of pelvic region: Secondary | ICD-10-CM | POA: Diagnosis not present

## 2022-01-24 DIAGNOSIS — M9902 Segmental and somatic dysfunction of thoracic region: Secondary | ICD-10-CM | POA: Diagnosis not present

## 2022-01-25 DIAGNOSIS — M9901 Segmental and somatic dysfunction of cervical region: Secondary | ICD-10-CM | POA: Diagnosis not present

## 2022-01-25 DIAGNOSIS — M9903 Segmental and somatic dysfunction of lumbar region: Secondary | ICD-10-CM | POA: Diagnosis not present

## 2022-01-25 DIAGNOSIS — M9905 Segmental and somatic dysfunction of pelvic region: Secondary | ICD-10-CM | POA: Diagnosis not present

## 2022-01-25 DIAGNOSIS — M9902 Segmental and somatic dysfunction of thoracic region: Secondary | ICD-10-CM | POA: Diagnosis not present

## 2022-02-08 DIAGNOSIS — M9903 Segmental and somatic dysfunction of lumbar region: Secondary | ICD-10-CM | POA: Diagnosis not present

## 2022-02-08 DIAGNOSIS — M9905 Segmental and somatic dysfunction of pelvic region: Secondary | ICD-10-CM | POA: Diagnosis not present

## 2022-02-08 DIAGNOSIS — M9902 Segmental and somatic dysfunction of thoracic region: Secondary | ICD-10-CM | POA: Diagnosis not present

## 2022-02-08 DIAGNOSIS — M9901 Segmental and somatic dysfunction of cervical region: Secondary | ICD-10-CM | POA: Diagnosis not present

## 2022-03-01 ENCOUNTER — Encounter: Payer: Self-pay | Admitting: Family Medicine

## 2022-03-01 MED ORDER — PREDNISONE 20 MG PO TABS: ORAL_TABLET | ORAL | 0 refills | Status: DC

## 2022-03-27 DIAGNOSIS — M9903 Segmental and somatic dysfunction of lumbar region: Secondary | ICD-10-CM | POA: Diagnosis not present

## 2022-03-27 DIAGNOSIS — M9905 Segmental and somatic dysfunction of pelvic region: Secondary | ICD-10-CM | POA: Diagnosis not present

## 2022-03-27 DIAGNOSIS — M9901 Segmental and somatic dysfunction of cervical region: Secondary | ICD-10-CM | POA: Diagnosis not present

## 2022-03-27 DIAGNOSIS — M9902 Segmental and somatic dysfunction of thoracic region: Secondary | ICD-10-CM | POA: Diagnosis not present

## 2022-04-06 ENCOUNTER — Telehealth: Payer: BC Managed Care – PPO | Admitting: Family Medicine

## 2022-04-06 DIAGNOSIS — B9689 Other specified bacterial agents as the cause of diseases classified elsewhere: Secondary | ICD-10-CM

## 2022-04-06 DIAGNOSIS — J019 Acute sinusitis, unspecified: Secondary | ICD-10-CM

## 2022-04-06 MED ORDER — DOXYCYCLINE HYCLATE 100 MG PO TABS: 100.0000 mg | ORAL_TABLET | Freq: Two times a day (BID) | ORAL | 0 refills | Status: AC

## 2022-04-06 NOTE — Progress Notes (Signed)
E-Visit for Sinus Problems  We are sorry that you are not feeling well.  Here is how we plan to help!  Based on what you have shared with me it looks like you have sinusitis.  Sinusitis is inflammation and infection in the sinus cavities of the head.  Based on your presentation I believe you most likely have Acute Bacterial Sinusitis.  This is an infection caused by bacteria and is treated with antibiotics. I have prescribed Doxycycline 168m by mouth twice a day for 10 days. You may use an oral decongestant such as Mucinex D or if you have glaucoma or high blood pressure use plain Mucinex. Saline nasal spray help and can safely be used as often as needed for congestion.  If you develop worsening sinus pain, fever or notice severe headache and vision changes, or if symptoms are not better after completion of antibiotic, please schedule an appointment with a health care provider.    Sinus infections are not as easily transmitted as other respiratory infection, however we still recommend that you avoid close contact with loved ones, especially the very young and elderly.  Remember to wash your hands thoroughly throughout the day as this is the number one way to prevent the spread of infection!  Home Care: Only take medications as instructed by your medical team. Complete the entire course of an antibiotic. Do not take these medications with alcohol. A steam or ultrasonic humidifier can help congestion.  You can place a towel over your head and breathe in the steam from hot water coming from a faucet. Avoid close contacts especially the very young and the elderly. Cover your mouth when you cough or sneeze. Always remember to wash your hands.  Get Help Right Away If: You develop worsening fever or sinus pain. You develop a severe head ache or visual changes. Your symptoms persist after you have completed your treatment plan.  Make sure you Understand these instructions. Will watch your  condition. Will get help right away if you are not doing well or get worse.  Thank you for choosing an e-visit.  Your e-visit answers were reviewed by a board certified advanced clinical practitioner to complete your personal care plan. Depending upon the condition, your plan could have included both over the counter or prescription medications.  Please review your pharmacy choice. Make sure the pharmacy is open so you can pick up prescription now. If there is a problem, you may contact your provider through [employer] have the prescription routed to another pharmacy.  Your safety is important to [place] If you have drug allergies check your prescription carefully.   For the next 24 hours you can use MyChart to ask questions about today's visit, request a non-urgent call back, or ask for a work or school excuse. You will get an email in the next two days asking about your experience. I hope that your e-visit has been valuable and will speed your recovery.  I provided 5 minutes of non face-to-face time during this encounter for chart review, medication and order placement, as well as and documentation.

## 2022-04-19 DIAGNOSIS — K219 Gastro-esophageal reflux disease without esophagitis: Secondary | ICD-10-CM | POA: Diagnosis not present

## 2022-04-19 DIAGNOSIS — M9902 Segmental and somatic dysfunction of thoracic region: Secondary | ICD-10-CM | POA: Diagnosis not present

## 2022-04-19 DIAGNOSIS — M9901 Segmental and somatic dysfunction of cervical region: Secondary | ICD-10-CM | POA: Diagnosis not present

## 2022-04-19 DIAGNOSIS — M9905 Segmental and somatic dysfunction of pelvic region: Secondary | ICD-10-CM | POA: Diagnosis not present

## 2022-04-19 DIAGNOSIS — M9903 Segmental and somatic dysfunction of lumbar region: Secondary | ICD-10-CM | POA: Diagnosis not present

## 2022-04-19 DIAGNOSIS — R490 Dysphonia: Secondary | ICD-10-CM | POA: Diagnosis not present

## 2022-04-19 DIAGNOSIS — R070 Pain in throat: Secondary | ICD-10-CM | POA: Diagnosis not present

## 2022-05-10 DIAGNOSIS — M9901 Segmental and somatic dysfunction of cervical region: Secondary | ICD-10-CM | POA: Diagnosis not present

## 2022-05-10 DIAGNOSIS — M9905 Segmental and somatic dysfunction of pelvic region: Secondary | ICD-10-CM | POA: Diagnosis not present

## 2022-05-10 DIAGNOSIS — M9902 Segmental and somatic dysfunction of thoracic region: Secondary | ICD-10-CM | POA: Diagnosis not present

## 2022-05-10 DIAGNOSIS — M9903 Segmental and somatic dysfunction of lumbar region: Secondary | ICD-10-CM | POA: Diagnosis not present

## 2022-05-24 DIAGNOSIS — M9903 Segmental and somatic dysfunction of lumbar region: Secondary | ICD-10-CM | POA: Diagnosis not present

## 2022-05-24 DIAGNOSIS — M9901 Segmental and somatic dysfunction of cervical region: Secondary | ICD-10-CM | POA: Diagnosis not present

## 2022-05-24 DIAGNOSIS — M9902 Segmental and somatic dysfunction of thoracic region: Secondary | ICD-10-CM | POA: Diagnosis not present

## 2022-05-24 DIAGNOSIS — M9905 Segmental and somatic dysfunction of pelvic region: Secondary | ICD-10-CM | POA: Diagnosis not present

## 2022-12-20 IMAGING — CR DG CHEST 2V
2 series · 2 of 2 positions shown · non-contrast
Comparison: 08/03/2017

CLINICAL DATA: Cough and wheezing

EXAM:
CHEST - 2 VIEW

[w chest pa]
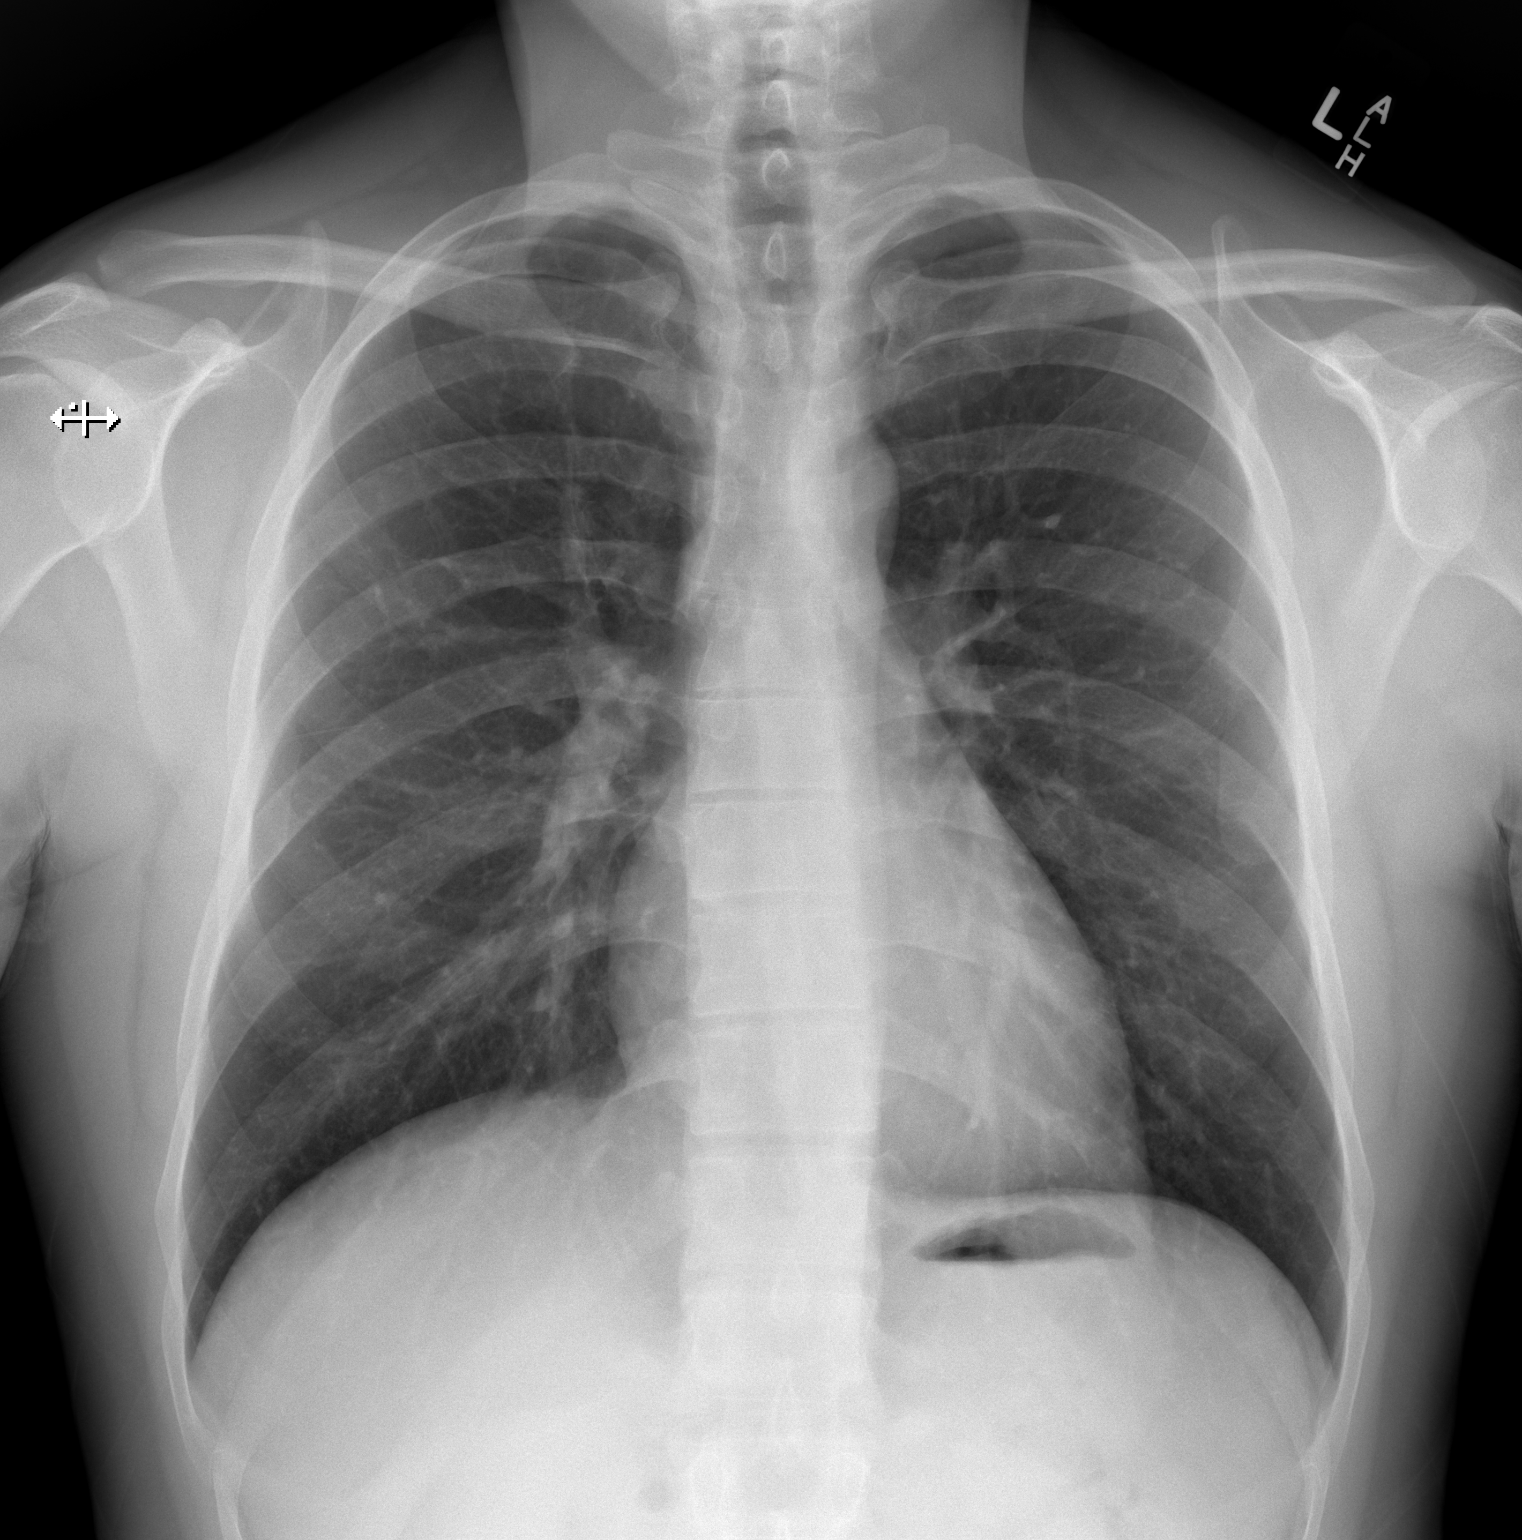

[w chest lat]
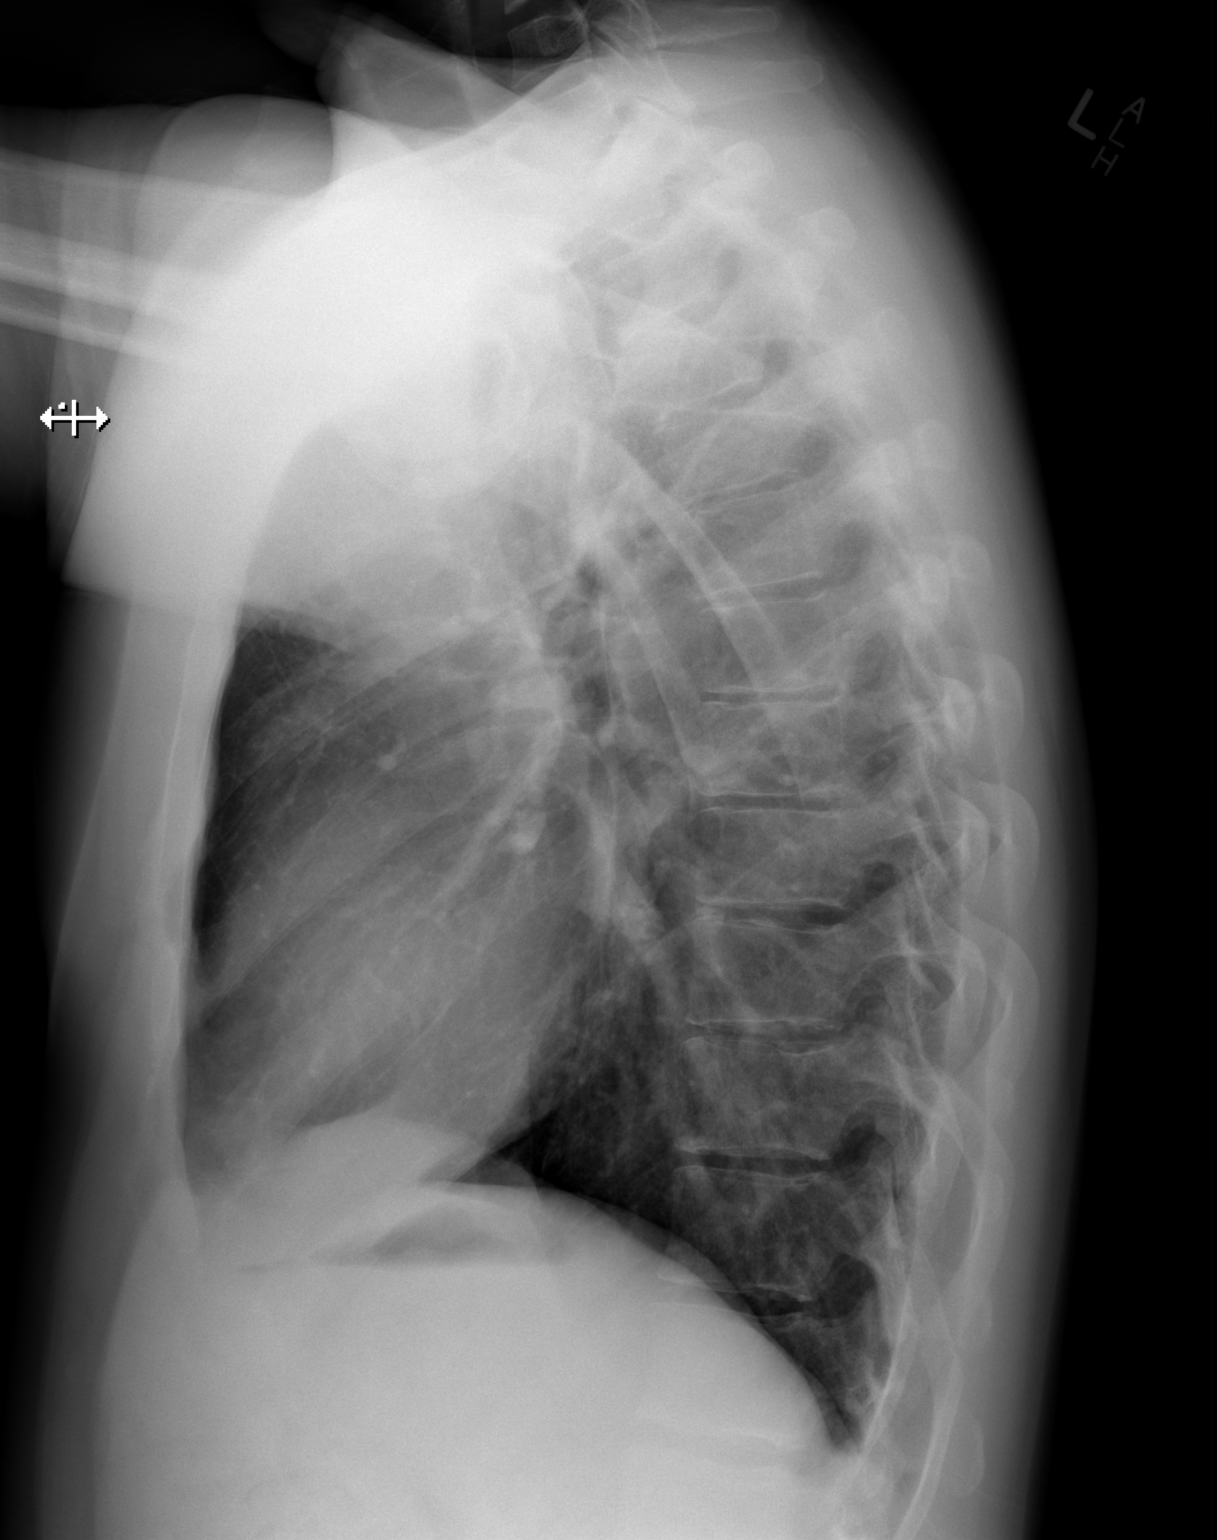

[2 of 2 positions shown; findings below may reference images not displayed]

FINDINGS: The heart size and mediastinal contours are within normal limits.
Both lungs are clear. The visualized skeletal structures are
unremarkable.
IMPRESSION: No active cardiopulmonary disease.

## 2023-05-15 ENCOUNTER — Telehealth: Payer: BC Managed Care – PPO | Admitting: Physician Assistant

## 2023-05-15 DIAGNOSIS — B9689 Other specified bacterial agents as the cause of diseases classified elsewhere: Secondary | ICD-10-CM

## 2023-05-15 NOTE — Progress Notes (Signed)
Thank you for submitting your e-visit. We are sorry that you are not feeling well! On review of your submission, you have either listed that you are in a state outside our coverage area or have listed a pharmacy outside that area. Our providers are licensed in [place] and [place], and cannot treat or send a medication to a patient outside that coverage area due to telehealth laws.   In order to make sure you are taken care of today, there are a few options:  (1) Contact your PCP to see if they can see you via video and send the medication in for you (2) You can click HERE to be taken to our [facility] site to be paired with a provider licensed in your area. When signing up for a visit, you need to select the state you are currently in so you will be able to select the proper providers (3) Be seen locally at an urgent care facility.   Thank you again for choosing [facility] for your care needs. We hope you feel better soon!
